# Patient Record
Sex: Female | Born: 1985 | Race: Black or African American | Hispanic: No | Marital: Single | Smoking: Never smoker
Health system: Southern US, Community
[De-identification: ages and names within clinical notes are randomized; demographics above are authoritative.]

## PROBLEM LIST (undated history)

## (undated) DIAGNOSIS — E282 Polycystic ovarian syndrome: Secondary | ICD-10-CM

## (undated) DIAGNOSIS — O24419 Gestational diabetes mellitus in pregnancy, unspecified control: Secondary | ICD-10-CM

## (undated) DIAGNOSIS — O139 Gestational [pregnancy-induced] hypertension without significant proteinuria, unspecified trimester: Secondary | ICD-10-CM

## (undated) HISTORY — PX: TOOTH EXTRACTION: SUR596

---
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
## 2005-11-26 HISTORY — PX: WISDOM TOOTH EXTRACTION: SHX21

## 2007-04-09 ENCOUNTER — Ambulatory Visit (HOSPITAL_COMMUNITY): Admission: RE | Admit: 2007-04-09 | Discharge: 2007-04-09 | Payer: Self-pay | Admitting: Obstetrics & Gynecology

## 2007-05-07 ENCOUNTER — Encounter: Payer: Self-pay | Admitting: Obstetrics & Gynecology

## 2007-05-07 ENCOUNTER — Inpatient Hospital Stay (HOSPITAL_COMMUNITY): Admission: AD | Admit: 2007-05-07 | Discharge: 2007-05-10 | Payer: Self-pay | Admitting: Obstetrics & Gynecology

## 2007-05-09 ENCOUNTER — Encounter: Payer: Self-pay | Admitting: Obstetrics & Gynecology

## 2007-05-15 ENCOUNTER — Inpatient Hospital Stay (HOSPITAL_COMMUNITY): Admission: AD | Admit: 2007-05-15 | Discharge: 2007-07-22 | Payer: Self-pay | Admitting: Obstetrics

## 2007-05-16 ENCOUNTER — Encounter: Payer: Self-pay | Admitting: Obstetrics & Gynecology

## 2007-06-16 ENCOUNTER — Encounter: Payer: Self-pay | Admitting: Obstetrics

## 2007-06-30 ENCOUNTER — Encounter: Payer: Self-pay | Admitting: Obstetrics & Gynecology

## 2007-07-07 ENCOUNTER — Encounter: Payer: Self-pay | Admitting: Obstetrics

## 2007-07-31 ENCOUNTER — Inpatient Hospital Stay (HOSPITAL_COMMUNITY): Admission: AD | Admit: 2007-07-31 | Discharge: 2007-08-07 | Payer: Self-pay | Admitting: Obstetrics & Gynecology

## 2009-03-05 IMAGING — US US OB DETAIL+14 WK
1 series · 14 of 28 positions shown · non-contrast
Comparison: none

OBSTETRICAL ULTRASOUND:
 This ultrasound was performed in The [HOSPITAL], and the [facility] report will be stored to [REDACTED] PACS.

[Series 1: us ob detail+14 wk · 14 of 92 slices shown]
[im 4/92]
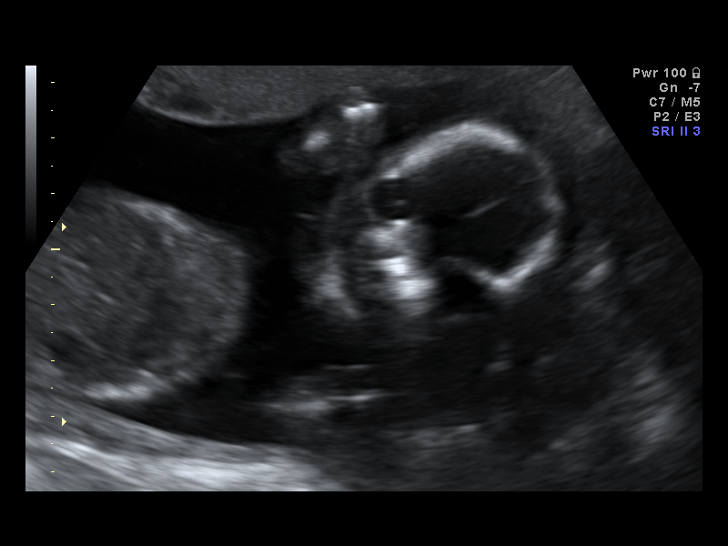
[im 11/92]
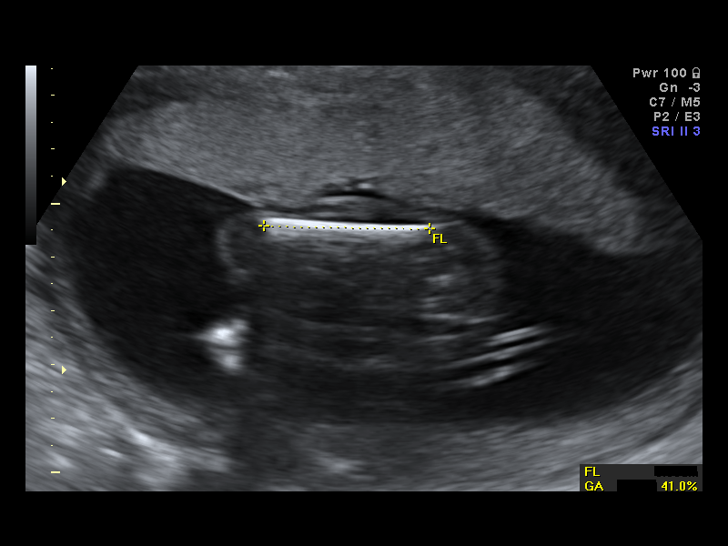
[im 17/92]
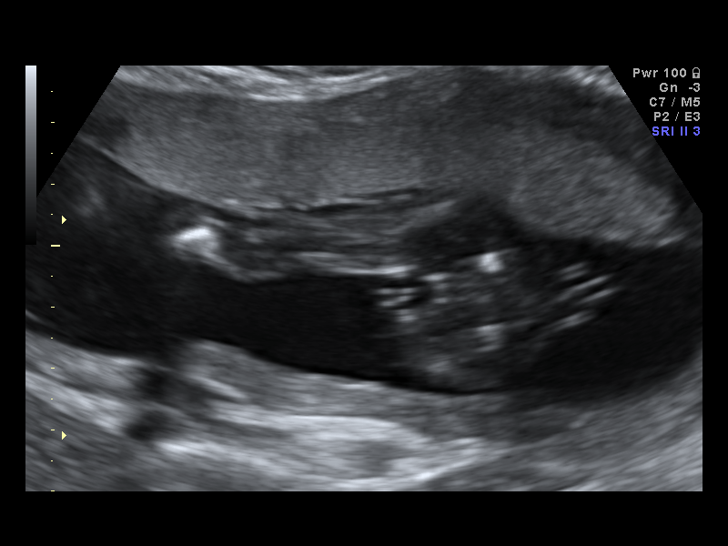
[im 24/92]
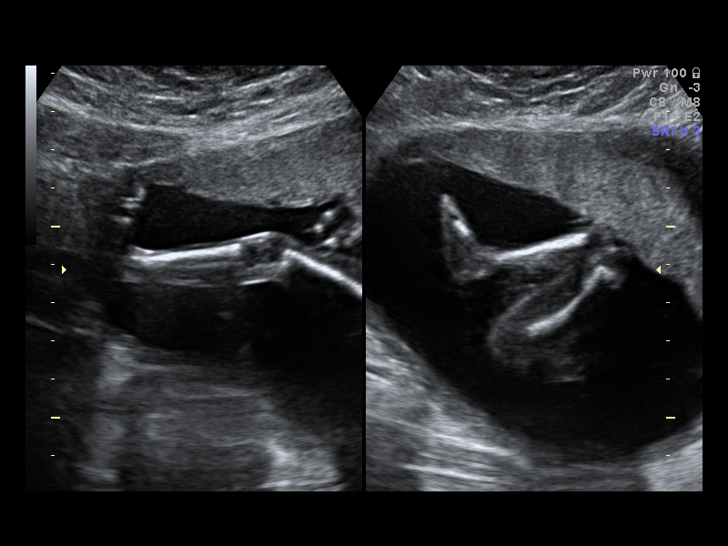
[im 31/92]
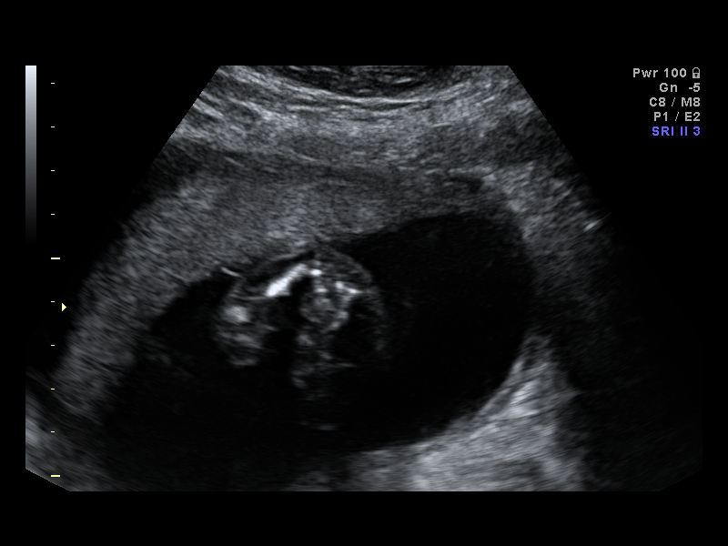
[im 38/92]
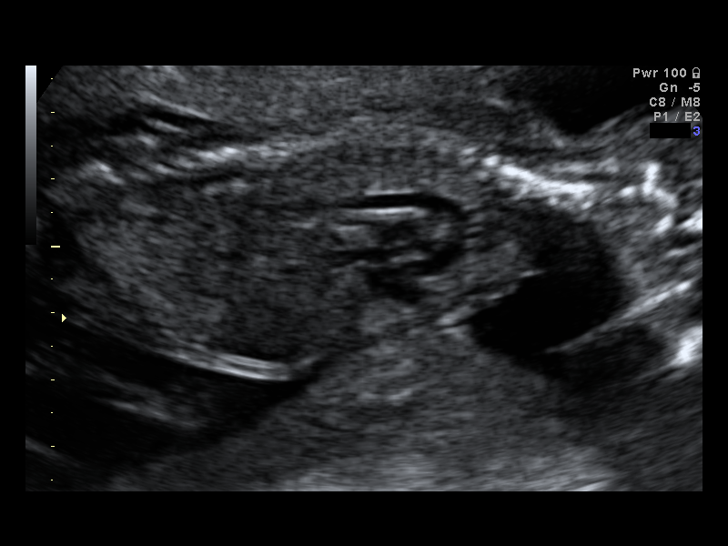
[im 44/92]
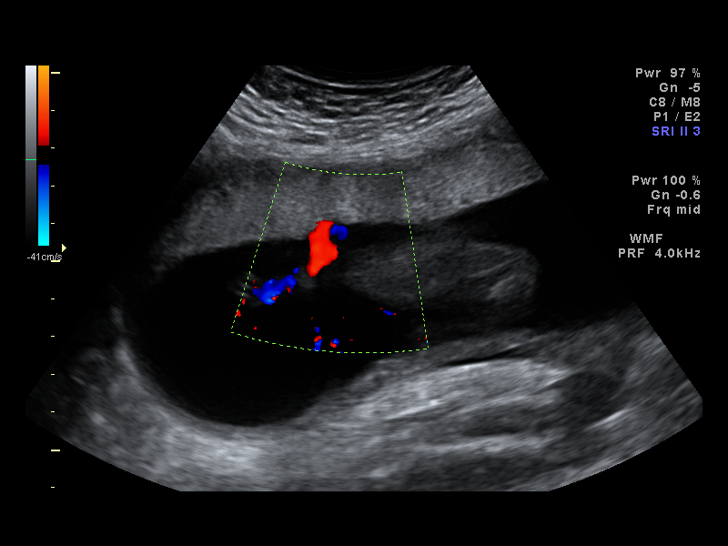
[im 51/92]
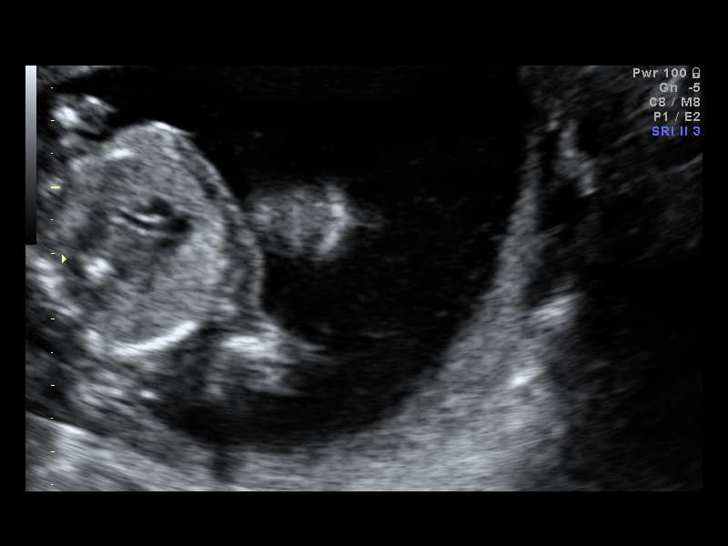
[im 58/92]
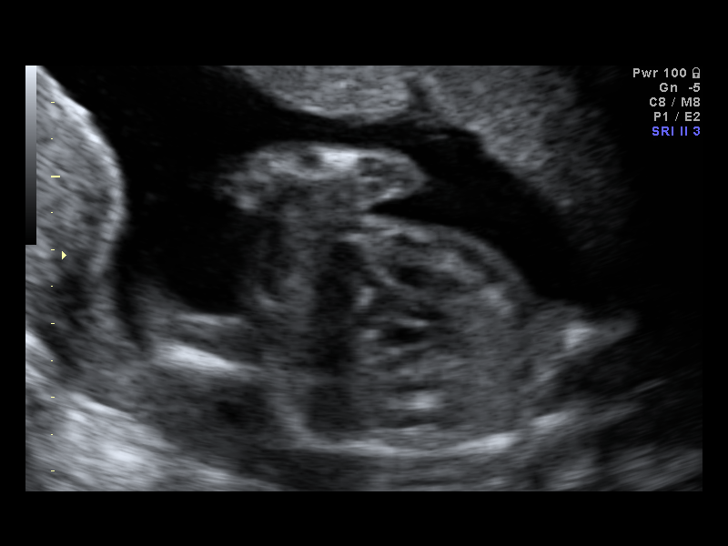
[im 65/92]
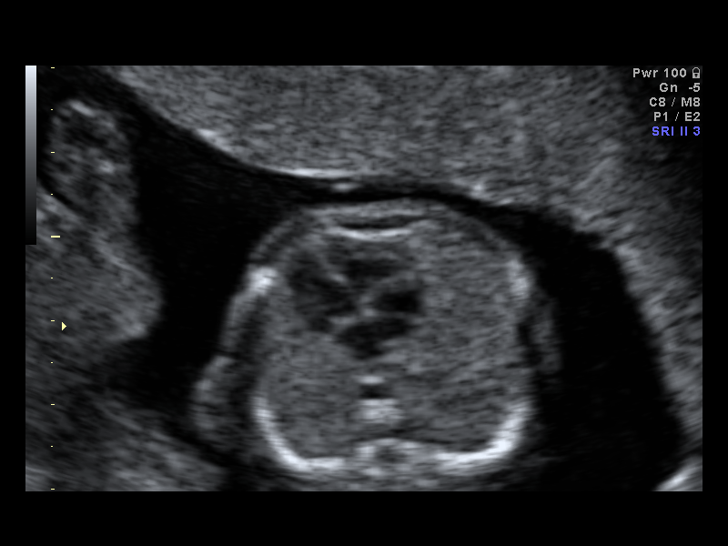
[im 71/92]
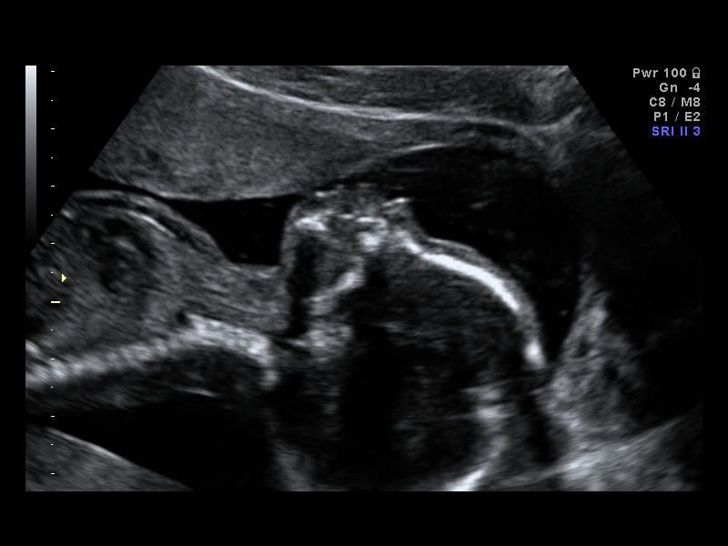
[im 78/92]
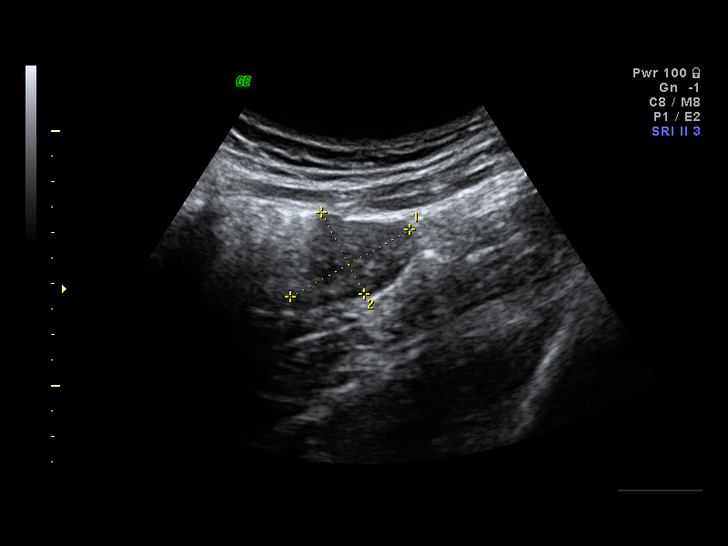
[im 85/92]
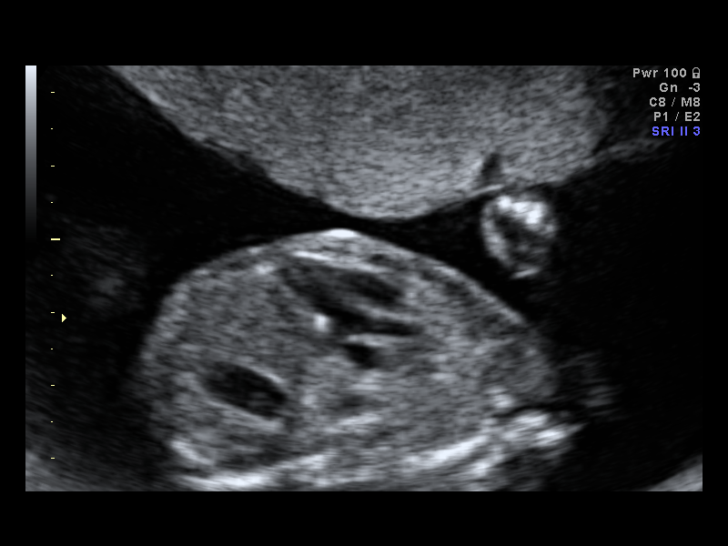
[im 92/92]
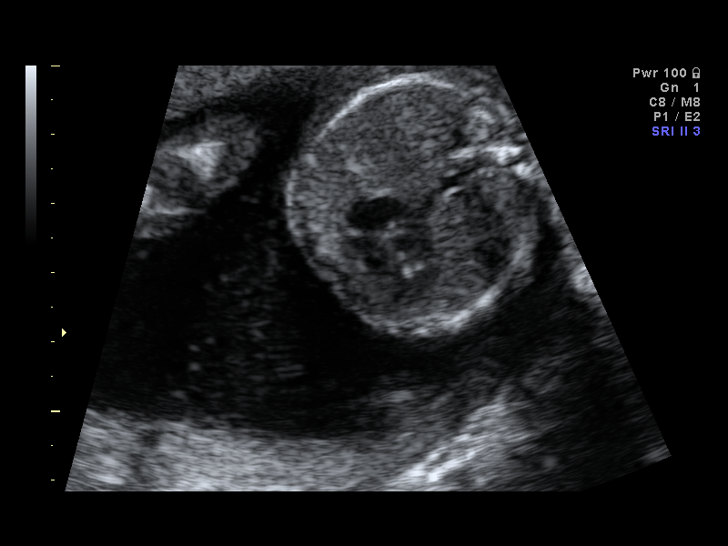

[14 of 28 positions shown; findings below may reference images not displayed]

IMPRESSION: The [facility] report has also been faxed to the ordering physician.

## 2009-04-04 IMAGING — US US OB TRANSVAGINAL
1 series · 13 of 13 positions shown · non-contrast
Comparison: none

OBSTETRICAL ULTRASOUND:
 This ultrasound was performed in The [HOSPITAL], and the [facility] report will be stored to [REDACTED] PACS.

[Series 1: us ob transvaginal · 13 acquisitions, 13 frames shown]
[im 1/13]
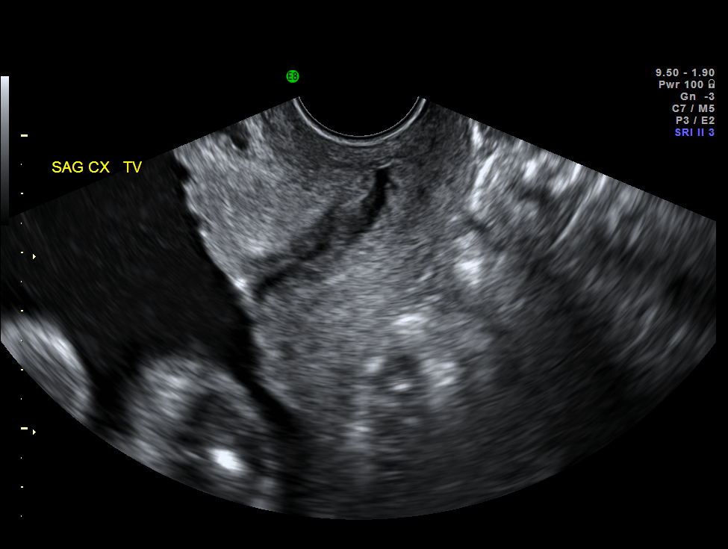
[im 2/13]
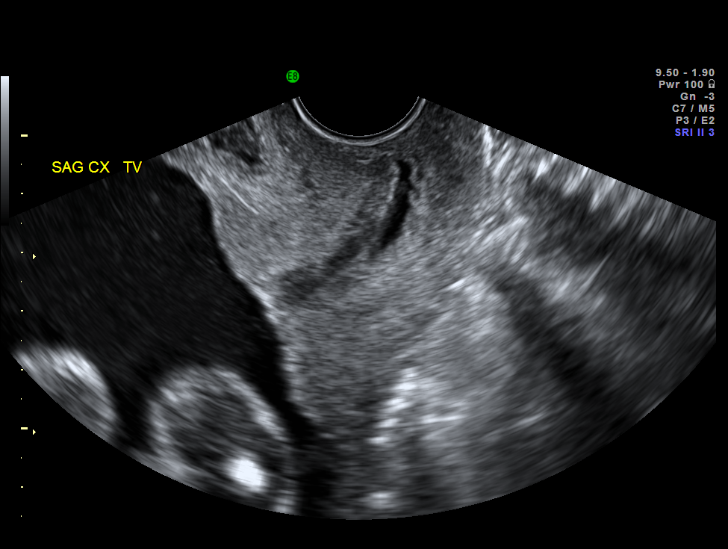
[im 3/13]
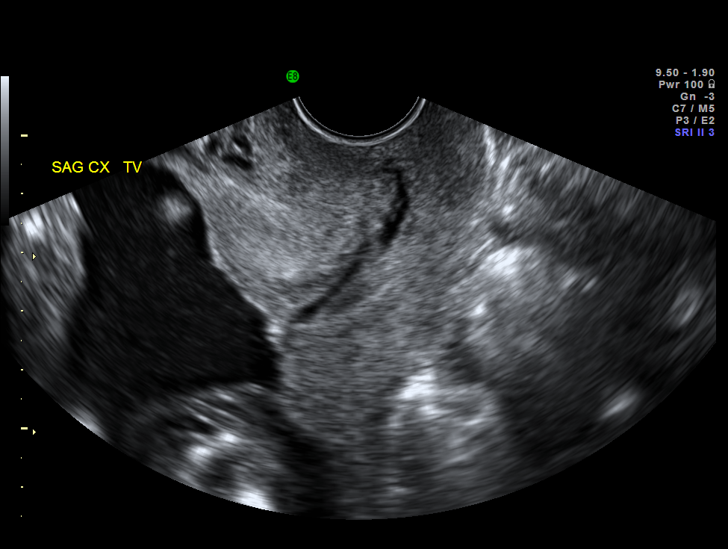
[im 4/13]
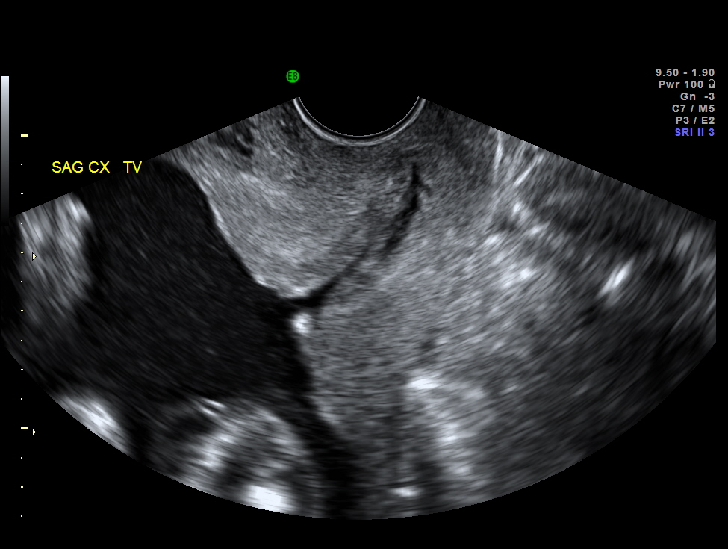
[im 5/13]
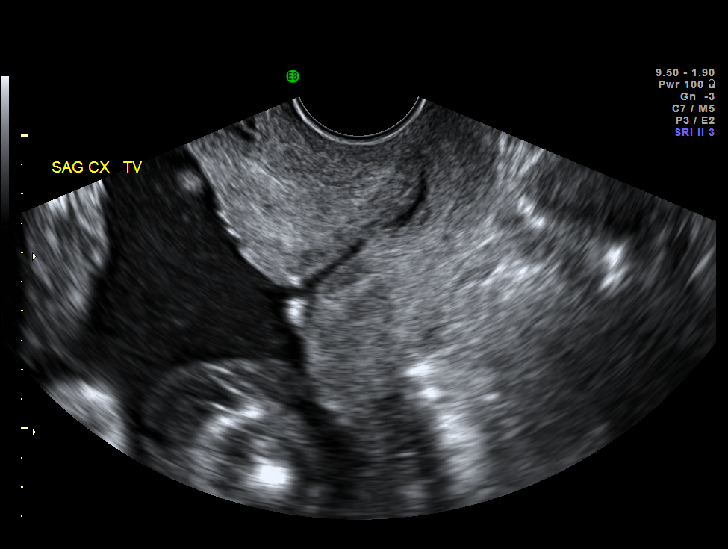
[im 6/13]
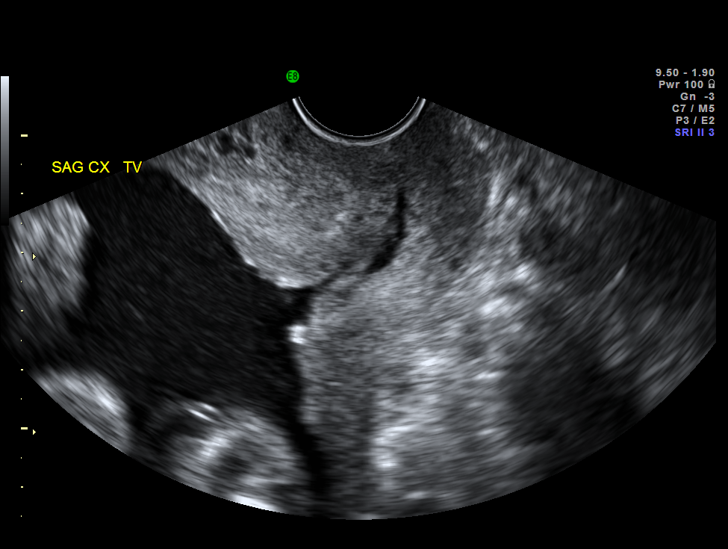
[im 7/13]
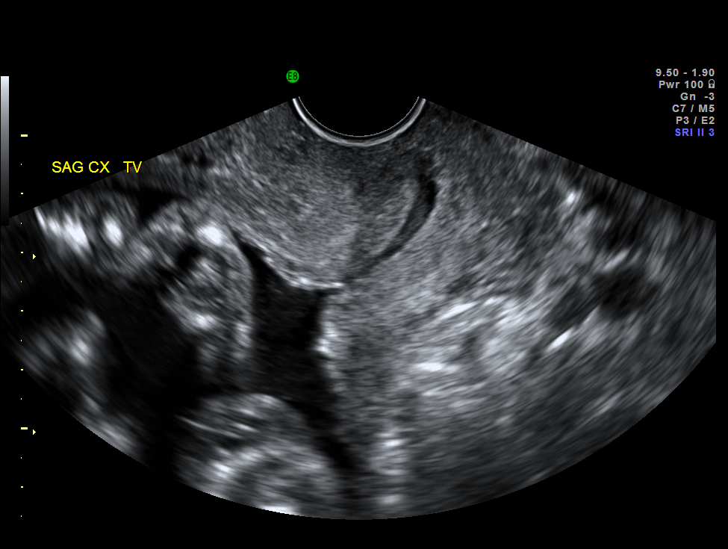
[im 8/13]
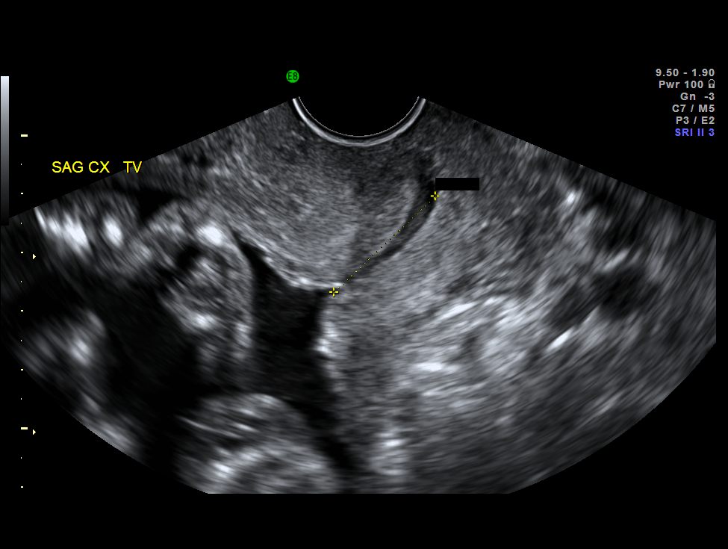
[im 9/13]
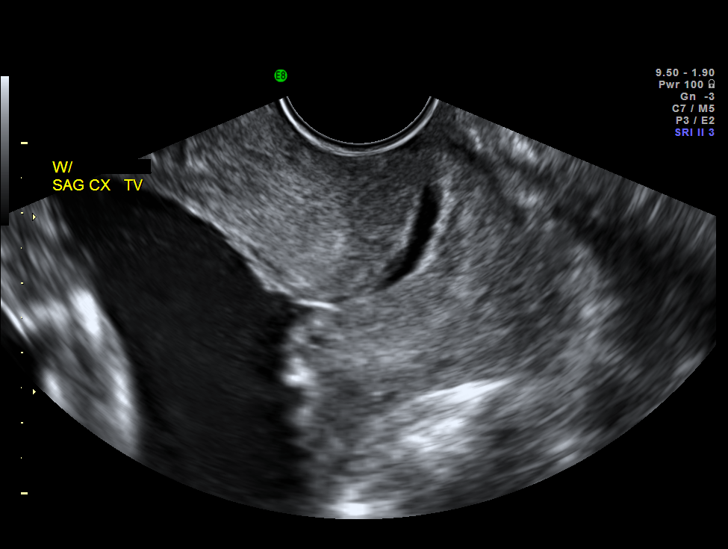
[im 10/13]
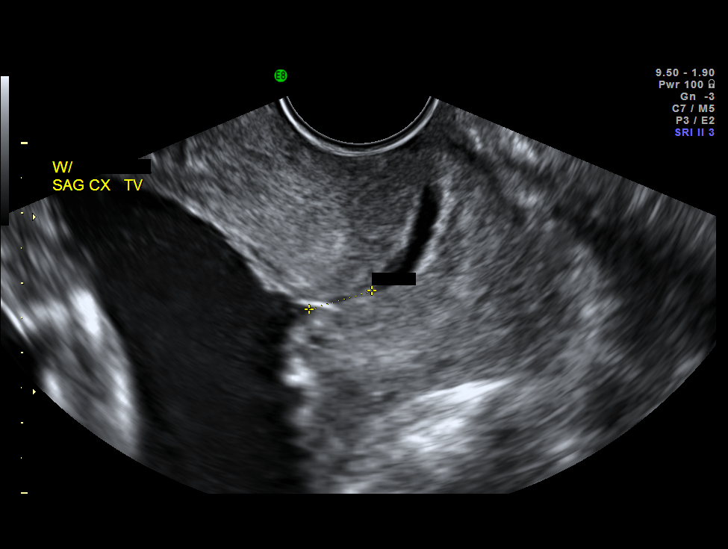
[im 11/13]
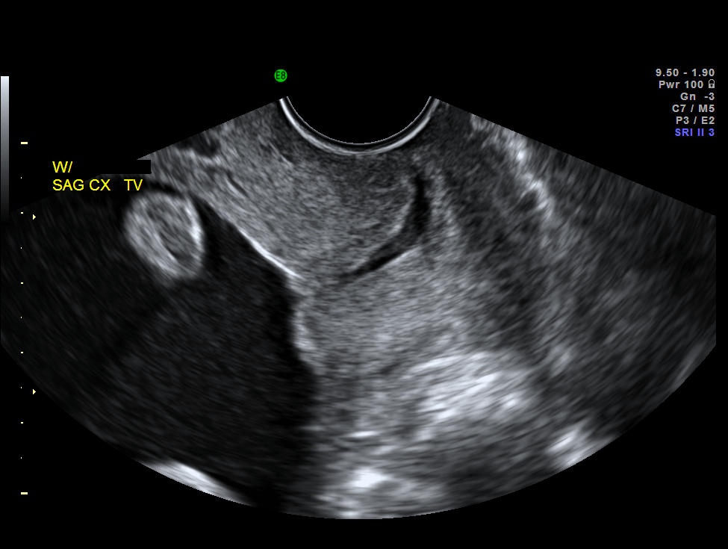
[im 12/13]
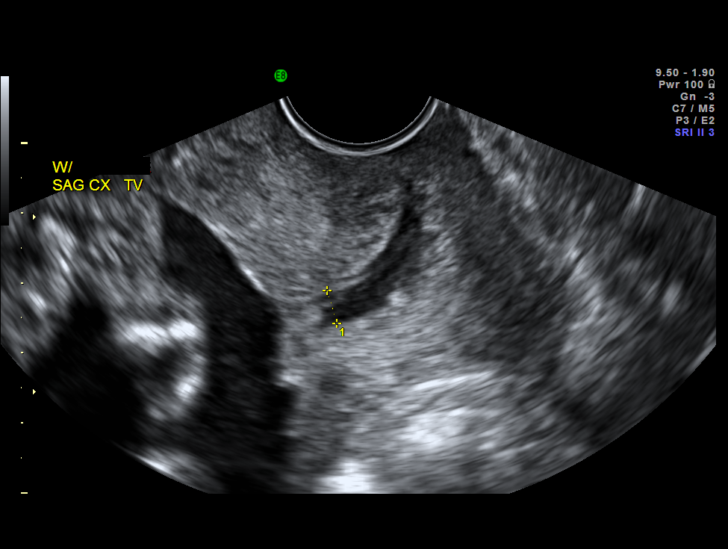
[im 13/13]
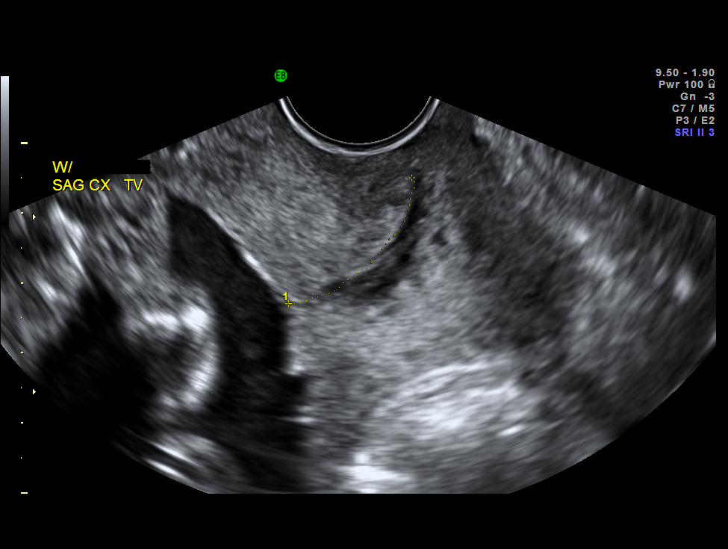

[13 of 13 positions shown; findings below may reference images not displayed]

IMPRESSION: The [facility] report has also been faxed to the ordering physician.

## 2009-06-21 ENCOUNTER — Emergency Department (HOSPITAL_BASED_OUTPATIENT_CLINIC_OR_DEPARTMENT_OTHER): Admission: EM | Admit: 2009-06-21 | Discharge: 2009-06-21 | Payer: Self-pay | Admitting: Emergency Medicine

## 2010-11-02 ENCOUNTER — Emergency Department (HOSPITAL_BASED_OUTPATIENT_CLINIC_OR_DEPARTMENT_OTHER): Admission: EM | Admit: 2010-11-02 | Discharge: 2010-10-18 | Payer: Self-pay | Admitting: Emergency Medicine

## 2011-02-06 LAB — URINALYSIS, ROUTINE W REFLEX MICROSCOPIC
Bilirubin Urine: NEGATIVE
Glucose, UA: NEGATIVE mg/dL
Hgb urine dipstick: NEGATIVE
Ketones, ur: 15 mg/dL — AB
pH: 6 (ref 5.0–8.0)

## 2011-02-06 LAB — URINE MICROSCOPIC-ADD ON

## 2011-03-04 LAB — URINE CULTURE: Colony Count: 45000

## 2011-03-04 LAB — URINALYSIS, ROUTINE W REFLEX MICROSCOPIC
Protein, ur: NEGATIVE mg/dL
Urobilinogen, UA: 1 mg/dL (ref 0.0–1.0)

## 2011-03-04 LAB — PREGNANCY, URINE: Preg Test, Ur: NEGATIVE

## 2011-03-04 LAB — GC/CHLAMYDIA PROBE AMP, GENITAL: Chlamydia, DNA Probe: NEGATIVE

## 2011-03-04 LAB — URINE MICROSCOPIC-ADD ON

## 2011-03-04 LAB — WET PREP, GENITAL: Trich, Wet Prep: NONE SEEN

## 2011-04-10 NOTE — H&P (Signed)
[name], [name]              ACCOUNT NO.:  [id number]   MEDICAL RECORD NO.:  [id number]          PATIENT TYPE:  INP   LOCATION:  9153                          FACILITY:  [facility]   PHYSICIAN:  Roseanna Rainbow, M.D.DATE OF BIRTH:  1986-05-17   DATE OF ADMISSION:  05/07/2007  DATE OF DISCHARGE:                              HISTORY & PHYSICAL   CHIEF COMPLAINT:  The patient is a 25 year old para 0 female with an  estimated date of confinement of 08/30/07  with an intrauterine  pregnancy at 23 plus weeks with dynamic changes seen on ultrasound  today.   HISTORY OF PRESENT ILLNESS:  Please see the above.   The patient had an initial anatomy scan several weeks prior and of note  there was an absent nasal bone.  Today's study was a follow up study.  Again the nasal bone was absent.  The patient declined genetic  amniocentesis but the cervix was noted to have dynamic changes with the  initial length being normal and then on subsequent images there was no  measurable cervix.   ALLERGIES:  No known drug allergies.   MEDICATIONS:  Please see the medication reconciliation form.   OB RISK FACTORS:  None.   PRENATAL LABORATORY STUDIES:  Urine culture and sensitivity, no  uropathogens.  Hepatitis B surface antigen negative.  Hematocrit of 41.  Hemoglobin of 13.3.  Platelets are 267,000.  Blood type is A positive,  antibody screen negative.  Rubella immune.  Sickle cell is negative.  Chlamydia probe is negative.  GC probe is negative.  Pap smear negative.  Varicella immune.  Wet prep in 03/08 was negative for gonorrheal  vaginalis.   SOCIAL HISTORY:  She denies any tobacco, ethanol, or drug use.   PAST MEDICAL HISTORY:  Eczema.   PAST SURGICAL HISTORY:  Oral surgery.   FAMILY HISTORY:  The family history is noncontributory.   PHYSICAL EXAMINATION:  VITAL SIGNS:  On physical examination the vital  signs show a temperature of 98.3, pulse of 93, respirations 18, blood  pressure of  140/75.  GENERAL:  Well-developed, well-nourished, in no apparent distress.  ABDOMEN:  Gravid, nontender.  PELVIC:  Examination deferred.   ASSESSMENT:  Intrauterine pregnancy at 23 plus weeks with rule out  cervical insufficiency versus threatened preterm labor.   PLAN:  Admission, bedrest, broad spectrum parenteral antibiotics.  Will  likely repeat the cervical length ultrasound in two days, follow blood  pressures.      Roseanna Rainbow, M.D.  Electronically Signed     LAJ/MEDQ  D:  05/07/2007  T:  05/07/2007  Job:  [id number]   cc:   Roseanna Rainbow, M.D.  Fax: [phone]

## 2011-04-10 NOTE — H&P (Signed)
[name], [name]              ACCOUNT NO.:  [id number]   MEDICAL RECORD NO.:  [id number]          PATIENT TYPE:  INP   LOCATION:  9149                          FACILITY:  [facility]   PHYSICIAN:  Roseanna Rainbow, M.D.DATE OF BIRTH:  1986/03/14   DATE OF ADMISSION:  05/15/2007  DATE OF DISCHARGE:                              HISTORY & PHYSICAL   CHIEF COMPLAINT:  The patient is a 25 year old para 0, with an estimated  date of confinement of August 30, 2007, with an intrauterine pregnancy  at 24+ weeks with cervical insufficiency with an ultrasound today  suggesting further shortening of the cervix.   HISTORY OF PRESENT ILLNESS:  Please see the above.  The patient was  recently hospitalized approximately one week ago with incidental dynamic  changes seen on ultrasound of the cervix.  The cervix remained stable on  bedrest in the hospital and she was discharged to home.  She presented  to the office today for followup.  She denied any complaints.  However,  on pelvic exam there was lower uterine segment development noted and the  cervix was felt approximately 50% effaced.  On an informal vaginal  ultrasound performed in the office the cervix had a large funnel with  the internal os being approximately 1-to-2-cm dilated.  The residual  closed length of cervix was approximately 1.5-cm.   ALLERGIES:  No known drug allergies.   MEDICATIONS:  Please see the medication reconciliation form.   OBSTETRICAL RISK FACTORS:  Absent nasal bone.   PRENATAL LABORATORY STUDIES:  Urine culture and sensitivity with no  uropathogens.  Hepatitis B surface antigen negative.  Hematocrit 41,  hemoglobin 13.3, platelets 267,000.  Blood type is A positive.  Antibody  screen negative.  Rubella immune.  Sickle cell is negative.  Chlamydia  probe is negative.  GC probe is negative.  Pap smear negative.  Varicella immune.  Wet prep in March 2008, was negative for Gardnerella  vaginalis.   SOCIAL HISTORY:   She denies any tobacco, ethanol, or drug use.   PAST MEDICAL HISTORY:  Eczema.   PAST SURGICAL HISTORY:  Oral surgery.   FAMILY HISTORY:  The family history is noncontributory.   PHYSICAL EXAMINATION:  VITAL SIGNS:  Stable afebrile.  GENERAL:  Well-developed, well-nourished, no apparent distress.  ABDOMEN:  Gravid.  PELVIC:  The cervix is approximately 50% effaced and closed.   ASSESSMENT:  Intrauterine pregnancy at 24+ weeks with cervical  insufficiency, now with an examination worrisome for progressive preterm  premature cervical change.   PLAN:  Admission, hospitalized bedrest, betamethasone, magnesium  sulfate, tocolysis.  Continue Prometrium per vagina.  Monitor closely.      Roseanna Rainbow, M.D.  Electronically Signed     LAJ/MEDQ  D:  05/15/2007  T:  05/15/2007  Job:  [id number]

## 2011-04-13 NOTE — Discharge Summary (Signed)
[name], Ball              ACCOUNT NO.:  [id number]   MEDICAL RECORD NO.:  [id number]          PATIENT TYPE:  INP   LOCATION:  9149                          FACILITY:  [facility]   PHYSICIAN:  Roseanna Rainbow, M.D.DATE OF BIRTH:  1986-03-26   DATE OF ADMISSION:  05/15/2007  DATE OF DISCHARGE:  07/22/2007                               DISCHARGE SUMMARY   CHIEF COMPLAINT:  The patient is a 25 year old para 0 with an estimated  date of confinement of August 30, 2007 with an intrauterine pregnancy at  24+ weeks with cervical insufficiency with an ultrasound earlier today  suggesting progressive shortening of the cervix.  Please see the  dictated history and physical for further details.   HOSPITAL COURSE:  The patient was admitted.  She was tocolized with  magnesium sulfate.  She also received a course of betamethasone.  The  Prometrium per vagina was continued as well.  The maternal fetal center  was consulted, and they continued to follow the patient.  On a repeat  ultrasound of the cervical length on June 28 there was no measurable  cervix noted with dynamic funneling noted.  A 1-hour GCT was performed,  and this was elevated.  This was followed by a 3-hour GTT.  On July 1 a  sterile vaginal exam was performed and the cervix was felt to be 60%  effaced with the increased lower uterine segment development.  On July  16 the patient was complaining of uterine contractions.  On a repeat  sterile vaginal exam the cervix was 1 and 60%, again with increased  lower uterine segment development appreciated.  The magnesium sulfate  was discontinued, and she was tocolized with terbutaline given  subcutaneously.  This was discontinued, and the patient was then changed  to oral Procardia for tocolysis.   The patient was also given a course of Indocin for 48 hours.  The  maternal fetal center was reconsulted on August 11, per their exam the  cervix was 3 cm dilated, 75% effaced with a vertex  at a -3.  The  recommendation was to continue hospitalized bed rest and tocolysis until  [redacted] weeks gestation.  On August 26 the cervix was reassessed, and it was  felt to be without change.  At this point the patient was 34-3/7 weeks  estimated gestational age, and decision was made to discontinue her  tocolytics as well as the Prometrium vaginally.  She was discharged to  home.   DISCHARGE DIAGNOSIS:  Cervical insufficiency, threatened preterm labor.   CONDITION:  Stable.   DIET:  Regular.   ACTIVITY:  Modified bed rest, pelvic rest.   MEDICATIONS:  Prenatal vitamins.   DISPOSITION:  The patient was to follow up in the office in several  days.      Roseanna Rainbow, M.D.  Electronically Signed     LAJ/MEDQ  D:  08/19/2007  T:  08/20/2007  Job:  [id number]

## 2011-04-13 NOTE — Discharge Summary (Signed)
[name], [name]              ACCOUNT NO.:  [id number]   MEDICAL RECORD NO.:  [id number]          PATIENT TYPE:  INP   LOCATION:                                [facility]ACILITY:  [facility]   PHYSICIAN:  Charles A. Clearance Coots, M.D.DATE OF BIRTH:  07-27-1986   DATE OF ADMISSION:  07/31/2007  DATE OF DISCHARGE:  08/07/2007                               DISCHARGE SUMMARY   ADMITTING DIAGNOSES:  Thirty-five weeks gestation, severe preeclampsia.   DISCHARGE DIAGNOSES:  Thirty-five weeks gestation, severe preeclampsia,  status post induction of labor for severe preeclampsia, normal  spontaneous vaginal delivery, viable female on August 01, 2007 at  20:06.  Apgars of 8 at 1 minute, 9 at 5 minutes.  Weight of 2295 grams,  length of 46.5 cm.  Mother and infant discharged home in good condition.   REASON FOR ADMISSION:  A 25 year old para 0, estimated date of  confinement of August 30, 2007, presents with elevated blood pressures  and therefore admitted for preeclampsia.   PAST MEDICAL HISTORY:  Surgery:  Oral surgery.  Illnesses:  Eczema.   MEDICATIONS:  Prenatal vitamins.   ALLERGIES:  NO KNOWN DRUG ALLERGIES   SOCIAL HISTORY:  Negative for tobacco, alcohol, or recreational drug  use.   PHYSICAL EXAM:  GENERAL:  Well-nourished, well-developed female in no  acute distress.  VITAL SIGNS:  Blood pressures were 140s/110s.  LUNGS:  Clear to auscultation bilaterally.  HEART:  Regular rate and rhythm.  ABDOMEN:  Gravid, nontender.  Cervix 2 to 3 cm dilated, 60% effaced,  vertex was at -3 station.   ADMITTING LABORATORY VALUES:  Hemoglobin 12, hematocrit 36, white blood  cell count 8,000, platelets 215,000.  Comprehensive metabolic panel was  significant for uric acid of 6.7.  Liver enzymes were within normal  limits.  Urine was significant for specific gravity greater than 1.030,  greater than 300 of protein, but was trace hemoglobin, mild bilirubin.   HOSPITAL COURSE:  The patient was admitted  and started on low-dose  Pitocin per protocol.  She progressed to normal spontaneous vaginal  delivery on August 01, 2007.  There were no complications.  Postpartum  course was uncomplicated.  The patient was discharged home, after blood  pressures had normalized on p.o. labetalol, on postpartum day #6.   DISCHARGE LABORATORY VALUES:  Hemoglobin 10, hematocrit 29, white blood  cell count 7,500, platelets 208,000.  Comprehensive metabolic panel was  within normal limits   DISCHARGE DISPOSITION:  Medications:  Continue prenatal vitamins.  Labetalol was prescribed for blood pressure management.  Routine written  instructions were given for discharge after vaginal delivery.  The  patient was to call the office for follow-up appointment in 1 week for  blood pressure check.      Charles A. Clearance Coots, M.D.  Electronically Signed     CAH/MEDQ  D:  08/22/2007  T:  08/23/2007  Job:  [id number]

## 2011-04-13 NOTE — Discharge Summary (Signed)
[name], [name]              ACCOUNT NO.:  [id number]   MEDICAL RECORD NO.:  [id number]          PATIENT TYPE:  INP   LOCATION:  9116                          [facility]ILITY:  [facility]   PHYSICIAN:  Roseanna Rainbow, M.D.DATE OF BIRTH:  08-15-1986   DATE OF ADMISSION:  07/31/2007  DATE OF DISCHARGE:  08/07/2007                               DISCHARGE SUMMARY   CHIEF COMPLAINTS:  The patient is a 25 year old para 0 with an estimated  date of confinement of October 4 with an intrauterine pregnancy at 23+  weeks with dynamic changes seen on an ultrasound earlier today.  Please  see the dictated history and physical for further details.   HOSPITAL COURSE:  The patient was admitted.  She was started on broad-  spectrum parenteral antibiotics.  On the cervical length ultrasound  performed on June 14, the cervix was 2.8 cm in length.  There was a 0.4-  cm funnel.  On digital exam, the cervix was long, closed and posterior.  At this point, the plan was to discharge the patient to home on bedrest  and pelvic rest.   DISCHARGE DIAGNOSIS:  Intrauterine pregnancy at 23+ weeks with cervical  insufficiency.   CONDITION:  Stable.   DIET:  Regular.   ACTIVITY:  Was bedrest, pelvic rest.   MEDICATIONS:  Included clindamycin and Prometrium.   DISPOSITION:  The patient was to follow up in the office in 1 week.      Roseanna Rainbow, M.D.  Electronically Signed     LAJ/MEDQ  D:  08/19/2007  T:  08/20/2007  Job:  [id number]

## 2011-04-17 ENCOUNTER — Inpatient Hospital Stay (HOSPITAL_COMMUNITY)
Admission: AD | Admit: 2011-04-17 | Discharge: 2011-04-17 | Disposition: A | Discharge status: Home / Self Care | Source: Ambulatory Visit | Attending: Obstetrics | Admitting: Obstetrics

## 2011-04-17 DIAGNOSIS — R109 Unspecified abdominal pain: Secondary | ICD-10-CM

## 2011-04-17 DIAGNOSIS — O99891 Other specified diseases and conditions complicating pregnancy: Secondary | ICD-10-CM | POA: Insufficient documentation

## 2011-04-17 DIAGNOSIS — O9989 Other specified diseases and conditions complicating pregnancy, childbirth and the puerperium: Secondary | ICD-10-CM

## 2011-04-17 LAB — URINALYSIS, ROUTINE W REFLEX MICROSCOPIC
Bilirubin Urine: NEGATIVE
Ketones, ur: 40 mg/dL — AB
Nitrite: NEGATIVE
Urobilinogen, UA: 2 mg/dL — ABNORMAL HIGH (ref 0.0–1.0)

## 2011-04-17 LAB — WET PREP, GENITAL: Yeast Wet Prep HPF POC: NONE SEEN

## 2011-05-01 LAB — ABO/RH: RH Type: POSITIVE

## 2011-05-01 LAB — CBC
HCT: 36 % (ref 36–46)
Hemoglobin: 11.9 g/dL — AB (ref 12.0–16.0)
Platelets: 262 10*3/uL (ref 150–399)

## 2011-05-01 LAB — HIV ANTIBODY (ROUTINE TESTING W REFLEX): HIV: NONREACTIVE

## 2011-05-01 LAB — RUBELLA ANTIBODY, IGM: Rubella: IMMUNE

## 2011-05-01 LAB — HEPATITIS B SURFACE ANTIGEN: Hepatitis B Surface Ag: NEGATIVE

## 2011-05-01 LAB — ANTIBODY SCREEN: Antibody Screen: NEGATIVE

## 2011-05-03 ENCOUNTER — Ambulatory Visit: Payer: Self-pay | Admitting: Obstetrics & Gynecology

## 2011-08-04 ENCOUNTER — Encounter (HOSPITAL_COMMUNITY): Payer: Self-pay | Admitting: *Deleted

## 2011-08-04 ENCOUNTER — Inpatient Hospital Stay (HOSPITAL_COMMUNITY)
Admission: AD | Admit: 2011-08-04 | Discharge: 2011-08-04 | Disposition: A | Discharge status: Home / Self Care | Source: Ambulatory Visit | Attending: Obstetrics & Gynecology | Admitting: Obstetrics & Gynecology

## 2011-08-04 DIAGNOSIS — R51 Headache: Secondary | ICD-10-CM | POA: Insufficient documentation

## 2011-08-04 DIAGNOSIS — R109 Unspecified abdominal pain: Secondary | ICD-10-CM | POA: Insufficient documentation

## 2011-08-04 DIAGNOSIS — O4702 False labor before 37 completed weeks of gestation, second trimester: Secondary | ICD-10-CM

## 2011-08-04 DIAGNOSIS — M7989 Other specified soft tissue disorders: Secondary | ICD-10-CM | POA: Insufficient documentation

## 2011-08-04 DIAGNOSIS — R42 Dizziness and giddiness: Secondary | ICD-10-CM | POA: Insufficient documentation

## 2011-08-04 DIAGNOSIS — O479 False labor, unspecified: Secondary | ICD-10-CM

## 2011-08-04 DIAGNOSIS — O9989 Other specified diseases and conditions complicating pregnancy, childbirth and the puerperium: Secondary | ICD-10-CM | POA: Insufficient documentation

## 2011-08-04 DIAGNOSIS — O139 Gestational [pregnancy-induced] hypertension without significant proteinuria, unspecified trimester: Secondary | ICD-10-CM

## 2011-08-04 DIAGNOSIS — O4700 False labor before 37 completed weeks of gestation, unspecified trimester: Secondary | ICD-10-CM

## 2011-08-04 DIAGNOSIS — O132 Gestational [pregnancy-induced] hypertension without significant proteinuria, second trimester: Secondary | ICD-10-CM

## 2011-08-04 HISTORY — DX: Gestational diabetes mellitus in pregnancy, unspecified control: O24.419

## 2011-08-04 LAB — URINALYSIS, ROUTINE W REFLEX MICROSCOPIC
Bilirubin Urine: NEGATIVE
Glucose, UA: NEGATIVE mg/dL
Glucose, UA: NEGATIVE mg/dL
Ketones, ur: 15 mg/dL — AB
Ketones, ur: 15 mg/dL — AB
Nitrite: NEGATIVE
Protein, ur: 30 mg/dL — AB
pH: 6 (ref 5.0–8.0)

## 2011-08-04 LAB — COMPREHENSIVE METABOLIC PANEL
Alkaline Phosphatase: 93 U/L (ref 39–117)
BUN: 5 mg/dL — ABNORMAL LOW (ref 6–23)
GFR calc Af Amer: >60 mL/min (ref >60)
GFR calc non Af Amer: >60 mL/min (ref >60)
Glucose, Bld: 92 mg/dL (ref 70–99)
Potassium: 3.3 mEq/L — ABNORMAL LOW (ref 3.5–5.1)
Total Protein: 6.1 g/dL (ref 6.0–8.3)

## 2011-08-04 LAB — URINE MICROSCOPIC-ADD ON

## 2011-08-04 LAB — CBC
HCT: 28 % — ABNORMAL LOW (ref 36.0–46.0)
Hemoglobin: 9 g/dL — ABNORMAL LOW (ref 12.0–15.0)
MCH: 25.8 pg — ABNORMAL LOW (ref 26.0–34.0)
MCHC: 32.1 g/dL (ref 30.0–36.0)

## 2011-08-04 LAB — URIC ACID: Uric Acid, Serum: 6.8 mg/dL (ref 2.4–7.0)

## 2011-08-04 MED ORDER — BUTALBITAL-APAP-CAFFEINE 50-325-40 MG PO TABS: 1.0000 | ORAL_TABLET | Freq: Four times a day (QID) | ORAL | Status: AC | PRN

## 2011-08-04 MED ORDER — BUTALBITAL-APAP-CAFFEINE 50-325-40 MG PO TABS
2.0000 | ORAL_TABLET | Freq: Once | ORAL | Status: AC
  Administered 2011-08-04: 2 via ORAL
  Filled 2011-08-04: qty 2

## 2011-08-04 MED ORDER — NIFEDIPINE 10 MG PO CAPS
10.0000 mg | ORAL_CAPSULE | ORAL | Status: DC | PRN
  Administered 2011-08-04 (×3): 10 mg via ORAL
  Filled 2011-08-04 (×3): qty 1

## 2011-08-04 NOTE — ED Provider Notes (Signed)
Chief Complaint:  Abdominal Cramping, Headache, Leg Swelling and Dizziness   [name] is  25 y.o. G2P0101.  No LMP recorded. Patient is pregnant..  [redacted]w[redacted]d She presents complaining of Abdominal Cramping, Headache, Leg Swelling and Dizziness Reports HA and dizziness that started at 0730 this morning, notices feet swelling this evening. Reports HA resolved spontaneous, denies visual disturbance. States hx of pre-x with last pregnancy requiring delivery at 35 weeks. Denies nausea, vomiting, or epigastric pain.  Obstetrical/Gynecological History: OB History    Grav Para Term Preterm Abortions TAB SAB Ect Mult Living   2 1 0 1 0 0 0 0 0 1       Past Medical History: Past Medical History  Diagnosis Date  . No pertinent past medical history   . Gestational diabetes     Past Surgical History: Past Surgical History  Procedure Date  . Wisdom tooth extraction 2007    Family History: No family history on file.  Social History: History  Substance Use Topics  . Smoking status: Never Smoker   . Smokeless tobacco: Never Used  . Alcohol Use: No    Allergies: No Known Allergies  Prescriptions prior to admission  Medication Sig Dispense Refill  . acetaminophen (TYLENOL) 500 MG tablet Take 500 mg by mouth every 6 (six) hours as needed. For pain       . prenatal vitamin w/FE, FA (PRENATAL 1 + 1) 27-1 MG TABS Take 1 tablet by mouth daily.          Review of Systems - Negative except what has been reviewed in the HPI  Physical Exam   Blood pressure 139/87, pulse 93, temperature 98.1 F (36.7 C), temperature source Oral, resp. rate 18, height 5\' 3"  (1.6 m), weight 82.555 kg (182 lb).  General: General appearance - alert, well appearing, and in no distress and overweight Mental status - alert, oriented to person, place, and time, normal mood, behavior, speech, dress, motor activity, and thought processes, affect appropriate to mood Chest - clear to auscultation, no wheezes, rales  or rhonchi, symmetric air entry Abdomen - Gravid, nontender Neurological - alert, oriented, normal speech, no focal findings or movement disorder noted, DTR's normal and symmetric Extremities - pedal edema 1+ Focused Gynecological Exam: normal external genitalia, vulva, vagina, cervix, uterus and adnexa, CERVIX: FT/Thick/post/vtx FHR: 130, mod variability, + 15x15 accels, no decels Toco: 4-6 palp mild  Labs: Recent Results (from the past 24 hour(s))  URINALYSIS, ROUTINE W REFLEX MICROSCOPIC   Collection Time   08/04/11  1:51 AM      Component Value Range   Color, Urine YELLOW  YELLOW    Appearance CLEAR  CLEAR    Specific Gravity, Urine 1.025  1.005 - 1.030    pH 6.0  5.0 - 8.0    Glucose, UA NEGATIVE  NEGATIVE (mg/dL)   Hgb urine dipstick NEGATIVE  NEGATIVE    Bilirubin Urine NEGATIVE  NEGATIVE    Ketones, ur 15 (*) NEGATIVE (mg/dL)   Protein, ur 30 (*) NEGATIVE (mg/dL)   Urobilinogen, UA 2.0 (*) 0.0 - 1.0 (mg/dL)   Nitrite NEGATIVE  NEGATIVE    Leukocytes, UA SMALL (*) NEGATIVE   URINE MICROSCOPIC-ADD ON   Collection Time   08/04/11  1:51 AM      Component Value Range   Squamous Epithelial / LPF FEW (*) RARE    WBC, UA 3-6  <3 (WBC/hpf)   RBC / HPF 0-2  <3 (RBC/hpf)   Bacteria, UA FEW (*) RARE  Crystals CA OXALATE CRYSTALS (*) NEGATIVE   CBC   Collection Time   08/04/11  2:04 AM      Component Value Range   WBC 6.1  4.0 - 10.5 (K/uL)   RBC 3.49 (*) 3.87 - 5.11 (MIL/uL)   Hemoglobin 9.0 (*) 12.0 - 15.0 (g/dL)   HCT 16.1 (*) 09.6 - 46.0 (%)   MCV 80.2  78.0 - 100.0 (fL)   MCH 25.8 (*) 26.0 - 34.0 (pg)   MCHC 32.1  30.0 - 36.0 (g/dL)   RDW 04.5  40.9 - 81.1 (%)   Platelets 211  150 - 400 (K/uL)  COMPREHENSIVE METABOLIC PANEL   Collection Time   08/04/11  2:04 AM      Component Value Range   Sodium 135  135 - 145 (mEq/L)   Potassium 3.3 (*) 3.5 - 5.1 (mEq/L)   Chloride 103  96 - 112 (mEq/L)   CO2 23  19 - 32 (mEq/L)   Glucose, Bld 92  70 - 99 (mg/dL)   BUN 5 (*) 6 -  23 (mg/dL)   Creatinine, Ser 9.14  0.50 - 1.10 (mg/dL)   Calcium 9.0  8.4 - 78.2 (mg/dL)   Total Protein 6.1  6.0 - 8.3 (g/dL)   Albumin 2.3 (*) 3.5 - 5.2 (g/dL)   AST 11  0 - 37 (U/L)   ALT 6  0 - 35 (U/L)   Alkaline Phosphatase 93  39 - 117 (U/L)   Total Bilirubin 0.2 (*) 0.3 - 1.2 (mg/dL)   GFR calc non Af Amer >60  >60 (mL/min)   GFR calc Af Amer >60  >60 (mL/min)  URIC ACID   Collection Time   08/04/11  2:04 AM      Component Value Range   Uric Acid, Serum 6.8  2.4 - 7.0 (mg/dL)   MD Consult: Discussed patient with Dr. Tamela Oddi. Will assess labs, BPs, and give procardia q 20 mins prn to max 3 doses for contractions Dr. Tamela Oddi updated, d/c home with 24 hour urine. FU in office on Monday  ED Course: cervix recheck, unchanged FT/Thick/post/vtx/-2  Assessment: Gestational Hypertension Braxton Hicks Contractions  Plan: Discharge home 24 hour urine Pre-x and PTL precautions FU at [facility] on Monday  Nazanin Kinner E. 08/04/2011,4:06 AM

## 2011-08-04 NOTE — Progress Notes (Signed)
G2P1 at 33.5wks. Seen MAU last pm with same symptoms. H/a, feet swelling, abdominal cramping. Hx preeclampsia with first del. And induced at 35wks. Also short cervix with first preg and on bedrest starting 25wks

## 2011-08-04 NOTE — Progress Notes (Signed)
Pt reports cramping "off and on" all day. Pt also reports lightheadedness and dizziness. Pt states that that she took tylenol for a headache at noon and it was relieved, but now it is back.

## 2011-08-04 NOTE — ED Provider Notes (Signed)
Chief Complaint: Abdominal Cramping, Headache, Leg Swelling and Dizziness   [name] is 25 y.o. G2P0101. No LMP recorded. Patient is pregnant.. [redacted]w[redacted]d She presents complaining of Abdominal Cramping, Headache, Leg Swelling and Dizziness. Pt presented to MAU last night with same symptoms. Reports taking tylenol at noon today for HA, states HA resolved but came back again this evening. Continues to reports lower ext swelling. Denies visual disturbance, epigastric pain, nausea, or vomiting. Reports good FM and occassional contractions.   Obstetrical/Gynecological History:  OB History    Grav  Para  Term  Preterm  Abortions  TAB  SAB  Ect  Mult  Living    2  1  0  1  0  0  0  0  0  1      Past Medical History:  Past Medical History   Diagnosis  Date   .  No pertinent past medical history    .  Gestational diabetes     Past Surgical History:  Past Surgical History   Procedure  Date   .  Wisdom tooth extraction  2007    Family History:  No family history on file.  Social History:  History   Substance Use Topics   .  Smoking status:  Never Smoker   .  Smokeless tobacco:  Never Used   .  Alcohol Use:  No    Allergies: No Known Allergies  Prescriptions prior to admission   Medication  Sig  Dispense  Refill   .  acetaminophen (TYLENOL) 500 MG tablet  Take 500 mg by mouth every 6 (six) hours as needed. For pain     .  prenatal vitamin w/FE, FA (PRENATAL 1 + 1) 27-1 MG TABS  Take 1 tablet by mouth daily.      Review of Systems - Negative except what has been reviewed in the HPI  Physical Exam   .vi General: General appearance - alert, well appearing, and in no distress and overweight  Mental status - alert, oriented to person, place, and time, normal mood, behavior, speech, dress, motor activity, and thought processes, affect appropriate to mood  Chest - clear to auscultation, no wheezes, rales or rhonchi, symmetric air entry Heart: RRR, w/o murmurs or bruits  Abdomen - Gravid,  nontender  Neurological - alert, oriented, normal speech, no focal findings or movement disorder noted, DTR's normal and symmetric  Extremities - pedal edema 1+ FHR: 140, mod variability, + 15x15, no decels Toco: no ctx  MD Consult: discussed patient with Dr. Tamela Oddi, agrees with plan. Pt to continue 24 hour urine, FU Monday as planned  Assessment:  Gestational Hypertension Fetal Testing c/w [facility]  Plan:   Discharge home  Continue 24 hours urine FU at [facility] on Monday as planned Pre-x precautions

## 2011-08-04 NOTE — Progress Notes (Signed)
Looking down at feet and noticed how swollen they were and remembered symptoms of preeclampsia that I had with my first daughter and though I needed to get it checked.  I had my first daughter at 82 weeks because of preeclampsia

## 2011-08-06 ENCOUNTER — Encounter: Payer: Self-pay | Admitting: Maternal and Fetal Medicine

## 2011-08-10 ENCOUNTER — Inpatient Hospital Stay (HOSPITAL_COMMUNITY)
Admission: AD | Admit: 2011-08-10 | Discharge: 2011-08-16 | DRG: 775 | Disposition: A | Discharge status: Home / Self Care | Source: Ambulatory Visit | Attending: Obstetrics & Gynecology | Admitting: Obstetrics & Gynecology

## 2011-08-10 ENCOUNTER — Encounter (HOSPITAL_COMMUNITY): Payer: Self-pay | Admitting: *Deleted

## 2011-08-10 DIAGNOSIS — O99814 Abnormal glucose complicating childbirth: Secondary | ICD-10-CM | POA: Diagnosis present

## 2011-08-10 DIAGNOSIS — O1490 Unspecified pre-eclampsia, unspecified trimester: Secondary | ICD-10-CM

## 2011-08-10 DIAGNOSIS — O9903 Anemia complicating the puerperium: Secondary | ICD-10-CM | POA: Diagnosis not present

## 2011-08-10 DIAGNOSIS — O139 Gestational [pregnancy-induced] hypertension without significant proteinuria, unspecified trimester: Principal | ICD-10-CM | POA: Diagnosis present

## 2011-08-10 DIAGNOSIS — D649 Anemia, unspecified: Secondary | ICD-10-CM | POA: Diagnosis not present

## 2011-08-10 DIAGNOSIS — A6000 Herpesviral infection of urogenital system, unspecified: Secondary | ICD-10-CM | POA: Insufficient documentation

## 2011-08-10 DIAGNOSIS — O24419 Gestational diabetes mellitus in pregnancy, unspecified control: Secondary | ICD-10-CM

## 2011-08-10 HISTORY — DX: Gestational (pregnancy-induced) hypertension without significant proteinuria, unspecified trimester: O13.9

## 2011-08-10 LAB — URINALYSIS, ROUTINE W REFLEX MICROSCOPIC
Bilirubin Urine: NEGATIVE
Glucose, UA: NEGATIVE mg/dL
Specific Gravity, Urine: 1.015 (ref 1.005–1.030)
Urobilinogen, UA: 0.2 mg/dL (ref 0.0–1.0)
pH: 6.5 (ref 5.0–8.0)

## 2011-08-10 LAB — COMPREHENSIVE METABOLIC PANEL
ALT: 12 U/L (ref 0–35)
AST: 22 U/L (ref 0–37)
Alkaline Phosphatase: 113 U/L (ref 39–117)
CO2: 23 mEq/L (ref 19–32)
Calcium: 8.2 mg/dL — ABNORMAL LOW (ref 8.4–10.5)
GFR calc non Af Amer: >60 mL/min (ref >60)
Potassium: 3.3 mEq/L — ABNORMAL LOW (ref 3.5–5.1)
Sodium: 137 mEq/L (ref 135–145)
Total Protein: 5.9 g/dL — ABNORMAL LOW (ref 6.0–8.3)

## 2011-08-10 LAB — URINE MICROSCOPIC-ADD ON

## 2011-08-10 LAB — CBC
MCH: 25.4 pg — ABNORMAL LOW (ref 26.0–34.0)
Platelets: 163 10*3/uL (ref 150–400)
RBC: 3.5 MIL/uL — ABNORMAL LOW (ref 3.87–5.11)

## 2011-08-10 LAB — PROTEIN / CREATININE RATIO, URINE
Creatinine, Urine: 115.87 mg/dL
Total Protein, Urine: 111.4 mg/dL

## 2011-08-10 MED ORDER — MAGNESIUM SULFATE BOLUS VIA INFUSION
4.0000 g | Freq: Once | INTRAVENOUS | Status: AC
  Administered 2011-08-10: 4 g via INTRAVENOUS
  Filled 2011-08-10: qty 500

## 2011-08-10 MED ORDER — PRENATAL PLUS 27-1 MG PO TABS
1.0000 | ORAL_TABLET | Freq: Every day | ORAL | Status: DC
  Administered 2011-08-11 – 2011-08-13 (×3): 1 via ORAL
  Filled 2011-08-10 (×3): qty 1

## 2011-08-10 MED ORDER — MAGNESIUM SULFATE 40 G IN LACTATED RINGERS - SIMPLE
1.0000 g/h | INTRAVENOUS | Status: DC
  Administered 2011-08-11 (×2): 1 g/h
  Filled 2011-08-10 (×2): qty 500

## 2011-08-10 MED ORDER — ACETAMINOPHEN 325 MG PO TABS: 650.0000 mg | ORAL_TABLET | ORAL | Status: DC | PRN

## 2011-08-10 MED ORDER — DOCUSATE SODIUM 100 MG PO CAPS
100.0000 mg | ORAL_CAPSULE | Freq: Every day | ORAL | Status: DC
  Administered 2011-08-11 – 2011-08-13 (×3): 100 mg via ORAL
  Filled 2011-08-10 (×3): qty 1

## 2011-08-10 MED ORDER — POTASSIUM CHLORIDE 2 MEQ/ML IV SOLN
INTRAVENOUS | Status: DC
  Administered 2011-08-10 – 2011-08-13 (×7): via INTRAVENOUS
  Filled 2011-08-10 (×11): qty 1000

## 2011-08-10 MED ORDER — BETAMETHASONE SOD PHOS & ACET 6 (3-3) MG/ML IJ SUSP
12.0000 mg | Freq: Once | INTRAMUSCULAR | Status: AC
  Administered 2011-08-10: 12 mg via INTRAMUSCULAR
  Filled 2011-08-10: qty 2

## 2011-08-10 MED ORDER — ZOLPIDEM TARTRATE 10 MG PO TABS
10.0000 mg | ORAL_TABLET | Freq: Every evening | ORAL | Status: DC | PRN
  Administered 2011-08-11 – 2011-08-12 (×2): 10 mg via ORAL
  Filled 2011-08-10 (×2): qty 1

## 2011-08-10 MED ORDER — LACTATED RINGERS IV SOLN
INTRAVENOUS | Status: DC
  Administered 2011-08-10: 22:00:00 via INTRAVENOUS

## 2011-08-10 MED ORDER — CALCIUM CARBONATE ANTACID 500 MG PO CHEW
2.0000 | CHEWABLE_TABLET | ORAL | Status: DC | PRN
  Administered 2011-08-13: 400 mg via ORAL
  Filled 2011-08-10: qty 2

## 2011-08-10 NOTE — Progress Notes (Signed)
This note also relates to the following rows which could not be included: BP - Cannot attach notes to rows marked as read only Pulse Rate - Cannot attach notes to rows marked as read only  08/10/11 2300  Provider Notification  Provider Name/Title Mliss Fritz, MD  Method of Notification Phone  Request Evaluate - remote  Notification Reason Order request;Lab/diagnostic study results   Dr. Gaynell Face notified of pt being GDM - diet controlled and Potassium of 3.3. Orders received for CBG monitoring and to change IV maintenance fluids.

## 2011-08-10 NOTE — Progress Notes (Signed)
BMZ - 1st dose given in left gluteal

## 2011-08-10 NOTE — Progress Notes (Signed)
Elevation in BP since last night, shortness of breath can't lay down or sit up, no position improves shortness of breath, nausea, diarrhea earlier.

## 2011-08-10 NOTE — Progress Notes (Signed)
TO RM 151 VIA W/C

## 2011-08-10 NOTE — ED Provider Notes (Signed)
History     Chief Complaint  Patient presents with  . Hypertension  . Nausea  . Diarrhea  . Shortness of Breath   HPI [name] is a 25 yo G2P0101 at 34.2 weeks who presents with SOB that started Wednesday that is getting worse.  Laying down on side makes the SOB worse.  Sitting up makes the SOB better.  Denies history of asthma.  Does have chest pain with trying to catch her breath.  Dry cough.  No fevers, chills, vomiting.  Mild rhinorrhea.  Watery diarrhea since yesterday.  Having increased LE edema.  Denies headache, vision changes, abdominal pain.  OB History    Grav Para Term Preterm Abortions TAB SAB Ect Mult Living   2 1 0 1 0 0 0 0 0 1       Past Medical History  Diagnosis Date  . Gestational diabetes   . Genital herpes     Past Surgical History  Procedure Date  . Wisdom tooth extraction 2007  . Tooth extraction     Family History  Problem Relation Age of Onset  . Diabetes Maternal Grandmother     History  Substance Use Topics  . Smoking status: Never Smoker   . Smokeless tobacco: Never Used  . Alcohol Use: No    Allergies: No Known Allergies  Prescriptions prior to admission  Medication Sig Dispense Refill  . prenatal vitamin w/FE, FA (PRENATAL 1 + 1) 27-1 MG TABS Take 1 tablet by mouth daily.        Marland Kitchen acetaminophen (TYLENOL) 500 MG tablet Take 500 mg by mouth every 6 (six) hours as needed. For pain       . butalbital-acetaminophen-caffeine (FIORICET) 50-325-40 MG per tablet Take 1-2 tablets by mouth every 6 (six) hours as needed for headache.  20 tablet  0    Review of Systems  All other systems reviewed and are negative.   Physical Exam   Blood pressure 148/95, pulse 73, temperature 98.9 F (37.2 C), temperature source Oral, resp. rate 18, height 5\' 3"  (1.6 m), weight 88.996 kg (196 lb 3.2 oz), SpO2 100.00%.  Serial BPs were done with lowest value being 141/90.  Physical Exam  Constitutional: She appears well-developed and  well-nourished.  HENT:  Head: Normocephalic and atraumatic.  Eyes: Pupils are equal, round, and reactive to light.  Neck: Normal range of motion. Neck supple.  Cardiovascular: Normal rate and regular rhythm.   Respiratory: Effort normal and breath sounds normal. No respiratory distress. She has no wheezes. She has no rales. She exhibits no tenderness.  GI: Soft. Bowel sounds are normal. She exhibits no distension and no mass. There is no tenderness. There is no rebound and no guarding.   Urine dipstick shows positive for protein of 100.   Lab Results  Component Value Date   WBC 6.5 08/10/2011   HGB 8.9* 08/10/2011   HCT 28.2* 08/10/2011   MCV 80.6 08/10/2011   PLT 163 08/10/2011   Lab Results  Component Value Date   CREATININE 0.67 08/10/2011   BUN 3* 08/10/2011   NA 137 08/10/2011   K 3.3* 08/10/2011   CL 104 08/10/2011   CO2 23 08/10/2011   Uric acid - 6.7 LD - 267   MAU Course  Procedures   Assessment and Plan  1.  Preeclampsia. Discussed pt with Dr Gaynell Face, who agree with admission and starting magnesium Sulfate - 4g loading and 2g continuous.  Will also obtain 24 hour protein and creatinine and  repeat labs in am.  Will obtain MFM consult in AM.  Criss Bartles JEHIEL 08/10/2011, 5:43 PM

## 2011-08-10 NOTE — Progress Notes (Signed)
UP TO B-ROOM-    START 24 HR URINE COLLECTION  WITH NEXT VOID.

## 2011-08-11 LAB — CBC
Hemoglobin: 8.2 g/dL — ABNORMAL LOW (ref 12.0–15.0)
MCH: 25.5 pg — ABNORMAL LOW (ref 26.0–34.0)
MCV: 80.1 fL (ref 78.0–100.0)
Platelets: 166 10*3/uL (ref 150–400)
RBC: 3.21 MIL/uL — ABNORMAL LOW (ref 3.87–5.11)
WBC: 7.2 10*3/uL (ref 4.0–10.5)

## 2011-08-11 LAB — CREATININE, URINE, 24 HOUR
Creatinine, 24H Ur: 1381 mg/d (ref 700–1800)
Urine Total Volume-UCRE24: 2800 mL

## 2011-08-11 LAB — GLUCOSE, CAPILLARY
Glucose-Capillary: 69 mg/dL — ABNORMAL LOW (ref 70–99)
Glucose-Capillary: 142 mg/dL — ABNORMAL HIGH (ref 70–99)
Glucose-Capillary: 114 mg/dL — ABNORMAL HIGH (ref 70–99)

## 2011-08-11 MED ORDER — BETAMETHASONE SOD PHOS & ACET 6 (3-3) MG/ML IJ SUSP
12.0000 mg | Freq: Once | INTRAMUSCULAR | Status: AC
  Administered 2011-08-11: 12 mg via INTRAMUSCULAR
  Filled 2011-08-11: qty 2

## 2011-08-11 MED ORDER — MAGNESIUM SULFATE 40 G IN LACTATED RINGERS - SIMPLE
2.0000 g/h | INTRAVENOUS | Status: AC
  Administered 2011-08-12: 2 g/h via INTRAVENOUS
  Filled 2011-08-11: qty 500

## 2011-08-11 NOTE — Progress Notes (Signed)
RN to the bedside, pulse ox sensor placed on right index finger - maternal pulse 90's. Decrease in FHR to 70's (for est. 10 sec.) with a return to baseline of 120's - no intervention required. Pt. In SF position.   RN will continue to monitor.

## 2011-08-11 NOTE — Plan of Care (Signed)
Problem: Consults Goal: Birthing Suites Patient Information Press F2 to bring up selections list  Pt < [redacted] weeks EGA and Diabetic         

## 2011-08-11 NOTE — Progress Notes (Signed)
RN to the bedside to adjust EFM - FHR -130 bpm.

## 2011-08-11 NOTE — H&P (Signed)
This is Dr. Francoise Ceo dictating the history and physical on  [name] She's a 25 year old gravida 2 para 0101 who is admitted because of PIH Her EDC is 09/19/2011 and she came to the hospital because her blood pressure was elevated at home Since she's been here diastolics range between 8 is in the 90s her urine protein was 1+ Labs normal platelets 161 She is undergoing a 24 urine collection at this time for protein and creatinine She's also on magnesium sulfate 4 g loading and 2 g per hour She also has occasional contractions Past medical history with her previous pregnancy she was hospitalized 10 weeks because of hypertension Past surgical history negative Social history negative System review negative Physical exam Well-developed female in no distress HEENT negative Lungs clear Heart regular rhythm no murmurs no gallops Abdomen 34 week size Pelvic deferred Extremities negative and

## 2011-08-11 NOTE — Progress Notes (Signed)
RN to the bedside, assist pt to BR for void.

## 2011-08-12 LAB — CBC
HCT: 25.5 % — ABNORMAL LOW (ref 36.0–46.0)
Hemoglobin: 8.2 g/dL — ABNORMAL LOW (ref 12.0–15.0)
MCHC: 32.2 g/dL (ref 30.0–36.0)
RBC: 3.12 MIL/uL — ABNORMAL LOW (ref 3.87–5.11)

## 2011-08-12 LAB — COMPREHENSIVE METABOLIC PANEL
ALT: 11 U/L (ref 0–35)
Alkaline Phosphatase: 95 U/L (ref 39–117)
BUN: 5 mg/dL — ABNORMAL LOW (ref 6–23)
CO2: 21 mEq/L (ref 19–32)
Chloride: 106 mEq/L (ref 96–112)
GFR calc Af Amer: >60 mL/min (ref >60)
GFR calc non Af Amer: >60 mL/min (ref >60)
Glucose, Bld: 126 mg/dL — ABNORMAL HIGH (ref 70–99)
Potassium: 4.3 mEq/L (ref 3.5–5.1)
Sodium: 136 mEq/L (ref 135–145)
Total Bilirubin: 0.2 mg/dL — ABNORMAL LOW (ref 0.3–1.2)
Total Protein: 5.8 g/dL — ABNORMAL LOW (ref 6.0–8.3)

## 2011-08-12 LAB — GLUCOSE, CAPILLARY
Glucose-Capillary: 123 mg/dL — ABNORMAL HIGH (ref 70–99)
Glucose-Capillary: 105 mg/dL — ABNORMAL HIGH (ref 70–99)

## 2011-08-12 MED ORDER — METHYLDOPA 500 MG PO TABS
500.0000 mg | ORAL_TABLET | Freq: Three times a day (TID) | ORAL | Status: DC
  Administered 2011-08-12 – 2011-08-13 (×4): 500 mg via ORAL
  Filled 2011-08-12 (×5): qty 1

## 2011-08-12 NOTE — Progress Notes (Signed)
Pt off monitor - to shower for a.m. ADLs. Fetal heart rate 140's.

## 2011-08-12 NOTE — Progress Notes (Signed)
  Diastolic blood pressures range between 80 and 98 24 protein 1300 mg patient is a chronic hypertensive To be seen by MFM on Monday and to have ultrasound at that time Magnesium was discontinued at 6 PM today patient will be started On Aldomet 500 by mouth q. 8 hours starting this a.m. She has no complaints

## 2011-08-13 ENCOUNTER — Inpatient Hospital Stay (HOSPITAL_COMMUNITY)

## 2011-08-13 DIAGNOSIS — O24419 Gestational diabetes mellitus in pregnancy, unspecified control: Secondary | ICD-10-CM | POA: Diagnosis present

## 2011-08-13 LAB — COMPREHENSIVE METABOLIC PANEL
Albumin: 2.3 g/dL — ABNORMAL LOW (ref 3.5–5.2)
BUN: 9 mg/dL (ref 6–23)
Calcium: 8.5 mg/dL (ref 8.4–10.5)
Creatinine, Ser: 0.66 mg/dL (ref 0.50–1.10)
GFR calc Af Amer: >60 mL/min (ref >60)
Glucose, Bld: 79 mg/dL (ref 70–99)
Total Protein: 5.9 g/dL — ABNORMAL LOW (ref 6.0–8.3)

## 2011-08-13 LAB — CBC
HCT: 25.2 % — ABNORMAL LOW (ref 36.0–46.0)
Hemoglobin: 7.9 g/dL — ABNORMAL LOW (ref 12.0–15.0)
MCHC: 31.3 g/dL (ref 30.0–36.0)
MCV: 82.4 fL (ref 78.0–100.0)
RDW: 16.1 % — ABNORMAL HIGH (ref 11.5–15.5)

## 2011-08-13 LAB — GLUCOSE, CAPILLARY: Glucose-Capillary: 80 mg/dL (ref 70–99)

## 2011-08-13 LAB — PROTEIN, URINE, 24 HOUR
Collection Interval-UPROT: 24 hours
Urine Total Volume-UPROT: 2800 mL

## 2011-08-13 LAB — URIC ACID: Uric Acid, Serum: 6.7 mg/dL (ref 2.4–7.0)

## 2011-08-13 MED ORDER — MAGNESIUM SULFATE 40 G IN LACTATED RINGERS - SIMPLE
2.0000 g/h | INTRAVENOUS | Status: DC
  Administered 2011-08-13: 2 g/h via INTRAVENOUS
  Filled 2011-08-13 (×2): qty 500

## 2011-08-13 MED ORDER — FERROUS SULFATE 325 (65 FE) MG PO TABS
325.0000 mg | ORAL_TABLET | Freq: Two times a day (BID) | ORAL | Status: DC
  Administered 2011-08-13 (×2): 325 mg via ORAL
  Filled 2011-08-13 (×2): qty 1

## 2011-08-13 MED ORDER — CITRIC ACID-SODIUM CITRATE 334-500 MG/5ML PO SOLN: 30.0000 mL | ORAL | Status: DC | PRN

## 2011-08-13 MED ORDER — OXYTOCIN 20 UNITS IN LACTATED RINGERS INFUSION - SIMPLE
125.0000 mL/h | Freq: Once | INTRAVENOUS | Status: AC
  Administered 2011-08-14: 999 mL/h via INTRAVENOUS

## 2011-08-13 MED ORDER — LABETALOL HCL 5 MG/ML IV SOLN
INTRAVENOUS | Status: AC
  Administered 2011-08-13: 20 mg via INTRAVENOUS
  Filled 2011-08-13: qty 4

## 2011-08-13 MED ORDER — ACETAMINOPHEN 325 MG PO TABS: 650.0000 mg | ORAL_TABLET | ORAL | Status: DC | PRN

## 2011-08-13 MED ORDER — LABETALOL HCL 5 MG/ML IV SOLN
20.0000 mg | Freq: Once | INTRAVENOUS | Status: AC
  Administered 2011-08-13: 20 mg via INTRAVENOUS

## 2011-08-13 MED ORDER — FUROSEMIDE 10 MG/ML IJ SOLN
10.0000 mg | Freq: Once | INTRAMUSCULAR | Status: AC
  Administered 2011-08-13: 10 mg via INTRAVENOUS
  Filled 2011-08-13: qty 1

## 2011-08-13 MED ORDER — IBUPROFEN 600 MG PO TABS: 600.0000 mg | ORAL_TABLET | Freq: Four times a day (QID) | ORAL | Status: DC | PRN

## 2011-08-13 MED ORDER — OXYCODONE-ACETAMINOPHEN 5-325 MG PO TABS: 2.0000 | ORAL_TABLET | ORAL | Status: DC | PRN

## 2011-08-13 MED ORDER — LACTATED RINGERS IV SOLN
INTRAVENOUS | Status: DC
  Administered 2011-08-14: 75 mL/h via INTRAVENOUS
  Administered 2011-08-14: 999 mL/h via INTRAVENOUS

## 2011-08-13 MED ORDER — MISOPROSTOL 25 MCG QUARTER TABLET
25.0000 ug | ORAL_TABLET | ORAL | Status: DC | PRN
  Administered 2011-08-13 – 2011-08-14 (×2): 25 ug via VAGINAL
  Filled 2011-08-13: qty 1
  Filled 2011-08-13 (×2): qty 0.25

## 2011-08-13 MED ORDER — PENICILLIN G POTASSIUM 5000000 UNITS IJ SOLR
5.0000 10*6.[IU] | Freq: Once | INTRAVENOUS | Status: AC
  Administered 2011-08-13: 5 10*6.[IU] via INTRAVENOUS
  Filled 2011-08-13: qty 5

## 2011-08-13 MED ORDER — FLEET ENEMA 7-19 GM/118ML RE ENEM: 1.0000 | ENEMA | RECTAL | Status: DC | PRN

## 2011-08-13 MED ORDER — TERBUTALINE SULFATE 1 MG/ML IJ SOLN: 0.2500 mg | Freq: Once | INTRAMUSCULAR | Status: AC | PRN

## 2011-08-13 MED ORDER — OXYTOCIN BOLUS FROM INFUSION
500.0000 mL | Freq: Once | INTRAVENOUS | Status: DC
  Filled 2011-08-13: qty 500
  Filled 2011-08-13: qty 1000

## 2011-08-13 MED ORDER — MAGNESIUM SULFATE BOLUS VIA INFUSION
4.0000 g | Freq: Once | INTRAVENOUS | Status: AC
  Administered 2011-08-13: 4 g via INTRAVENOUS
  Filled 2011-08-13: qty 500

## 2011-08-13 MED ORDER — LACTATED RINGERS IV SOLN: 500.0000 mL | INTRAVENOUS | Status: DC | PRN

## 2011-08-13 MED ORDER — ONDANSETRON HCL 4 MG/2ML IJ SOLN: 4.0000 mg | Freq: Four times a day (QID) | INTRAMUSCULAR | Status: DC | PRN

## 2011-08-13 MED ORDER — PENICILLIN G POTASSIUM 5000000 UNITS IJ SOLR
2.5000 10*6.[IU] | INTRAVENOUS | Status: DC
  Administered 2011-08-14: 2.5 10*6.[IU] via INTRAVENOUS
  Filled 2011-08-13 (×4): qty 2.5

## 2011-08-13 MED ORDER — LIDOCAINE HCL (PF) 1 % IJ SOLN
30.0000 mL | INTRAMUSCULAR | Status: DC | PRN
  Filled 2011-08-13 (×2): qty 30

## 2011-08-13 NOTE — Progress Notes (Signed)
Dr. Tamela Oddi called. Update given and discussed plan of care. MD gave orders for induction of labor

## 2011-08-13 NOTE — Progress Notes (Signed)
UR chart review completed.  

## 2011-08-13 NOTE — Progress Notes (Signed)
Admitted at  [redacted] weeks gestation, with preeclampsia and GDM(diet controlled).  Height  63" Weight 196 lbs pre-pregnancy weight 162 lbs.Pre-pregnancy  BMI 27.4 (overweight)  IBW 115 lbs  Total weight gain 34 lbs. Weight gain goals15-25 lbs.   Estimated needs: 18-2000 kcal/day, 63-73 grams protein/day, 2 liters fluid/day Carbohydrate modified diet tolerated well, appetite good.  nutrition related labs: CBG's 9/16 - 9/17 80-155, Hct 25% (on FeSo4 325 mg bid and PNV w/ iron)  Pt on CHO mod diet designed for pts that are not pregnant, and allows foods typically eliminated on the GDM diet. Given that some of Pts. CBG's are slightly elevated, the CHO mod Gestational diet may promote better glucose control Pt never received diet education on GDM diet. Briefly reviewed diet and gave pt a copy. Instructions on how to order snacks on GDM diet also given to pt.  Nutrition Dx: Impaired CHO utilization r/t pregnancy and dx of GDM aeb elevated CBG's .  No educational needs assessed at this time.

## 2011-08-13 NOTE — Progress Notes (Signed)
  S: Preterm labor symptoms: UCs/SOB decreased  O: Blood pressure 137/94, pulse 81, temperature 98.3 F (36.8 C), temperature source Oral, resp. rate 20, height 5\' 3"  (1.6 m), weight 88.996 kg (196 lb 3.2 oz), SpO2 97.00%.  CXR/ I/O reviewed   FHT: reassuring Toco: irregular WGN:FAOZHYQM: Closed Effacement (%): Thick Cervical Position: Posterior Exam by:: s.cole,rn  A/P- 25 y.o. now severe preeclampsia/pulmonary edema Dating:  [redacted]w[redacted]d Unfavorable Bishop's score Transfer--> L&D for IOL Lasix IVP Continue MgSO4 Monitor I&O Maintain O2 sats >95% PCN for GBS prophylaxis  FWB:  Fetal heart tracing c/w FWB  ROD: SVD

## 2011-08-13 NOTE — Consult Note (Signed)
MATERNAL FETAL MEDICINE CONSULT  Patient Name: [name] Medical Record Number:  [id number] Date of Birth: 09/25/1986 Requesting Physician Name:  Roseanna Rainbow, MD Date of Service: 08/13/2011  Chief Complaint Preeclampsia, with history of prior pregnancy complicated by preeclampsia. Gestational diabetes HSV chronic infection  History of Present Illness [name] was seen today for prenatal diagnosis secondary to preeclampsiaat the request of Dr. Tamela Oddi.  The patient is a 25 y.o. G2P0101, with an EDD of 09/19/2011, Alternate EDD Entry dating method.  She had preeclampsia at the end of her last pregnancy, delivered at 35 weeks.  She denies any findings of hypertension between pregnancies or in this pregnancy up to a week ago.  She describes headaches without visual disturbance or abdominal pain.  Review of Systems A comprehensive review of systems was negative except WUJ:WJXB persistent headache. She denies visual signs and symptoms, abdominal pain, sensory complaints.  She describes improved lower extremity edema since admission on 9/14.   Patient History OB History    Grav Para Term Preterm Abortions TAB SAB Ect Mult Living   2 1 0 1 0 0 0 0 0 1      # Outc Date GA Lbr Len/2nd Wgt Sex Del Anes PTL Lv   1 PRE 2008 [redacted]w[redacted]d   F SVD EPI  Yes   Comments: Induced for preeclampsia @ 35 weeks, was on bedrest for shortened cervix also   2 CUR               Past Medical History  Diagnosis Date  . Gestational diabetes; diet controlled   . Genital herpes: dormant   . Preterm labor hsitory     1st baby delivered at 35 wks  . Pregnancy induced hypertension     with 1st pregnancy    Past Surgical History  Procedure Date  . Wisdom tooth extraction 2007  . Tooth extraction     History   Social History  . Marital Status: Married    Spouse Name: N/A    Number of Children: 1  . Years of Education: N/A   Social History Main Topics  . Smoking status:  Never Smoker   . Smokeless tobacco: Never Used  . Alcohol Use: No  . Drug Use: No  . Sexually Active: Not Currently     last interaction 02/2011    Other Topics Concern  . None   Social History Narrative  . None    Family History  Problem Relation Age of Onset  . Diabetes Maternal Grandmother    In addition, the patient has no family history of mental retardation, birth defects, or genetic diseases.  Physical Examination Filed Vitals:   08/13/11 1225  BP: 153/101; previously   Pulse: 71  Temp:   Resp: 20   Normal maternal respiratory and heart rates. No abdominal tenderness.  Pitting edema 1+ Reflexes 1-2/4. Ultrasound of fetus: Report in [facility].   Labs reviewed:  24 hour urine protein: 1.3 gm FBS and post prandial BS are generally normal, although some FBS are >95 mg/dL Normal renal, hepatic enzyme and platelet results. Urine outputs are normal.  CBC shows evidence of hypochromic normocytic anemia.   Assessment and Recommendations Preeclampsia;  1. Continue hospital monitoring of maternal and fetal condition; 2. Repeat renal, hepatic and platelet functions 1-2 times/week;  3. Daily NST's.  4. Weekly U/S reassessment of AFI. 5. Stop antihypertensive medications as they are of no proven benefit and may affect effective uterine blood flow.  6. Deliver if  evidence of worsening headaches, BP's persistently >160 mmHg systolic, >100 mmHg diastolic; persistent headaches or visual signs;  abnormal renal or hepatic function tests ( >2X Normal), platelet count <100K/uL; bleeding; abnormal fetal testing.  7. Deliver at 37 weeks if stable before then. Gestational diabetes:  1. Continue blood glucose testing at fasting and 2 hr PP 1. Continue hospital monitoring of maternal and fetal condition; 2. Continue CHO prudent diet. 3. Consider institution of NPH insulin 20 units/day if persistent evidence of fasting hyperglycemia. HSV: 1. Acycllovir or valacyclovir  prophylaxis until delivery. Anemia: 1. Continue iron replacement  2. If hemoglobin electrophoresis has never been performed, recommend same to establish presence or absence of sickle trait.  I spent 30 minutes in face to face consultation with the patient.  Thank you for the opportunity to provide consultation.   Finnleigh Marchetti,JOE

## 2011-08-13 NOTE — Progress Notes (Signed)
Pt transport to L& D via wheelchair. Report given to [facility]

## 2011-08-13 NOTE — Progress Notes (Signed)
  S: No complaints.  O: Blood pressure 135/86, pulse 76, temperature 98.1 F (36.7 C), temperature source Oral, resp. rate 18, height 5\' 3"  (1.6 m), weight 88.996 kg (196 lb 3.2 oz), SpO2 98.00%. I&O reviewed   ZOX:WRUEAVWU: 140 bpm, Variability: Good {> 6 bpm) and Accelerations: Reactive Toco: None SVE:   Deferred  A/P- 25 y.o. admitted with gestational diabetes and hypertension  Dating:  [redacted]w[redacted]d MFM consult IV-->NS heplock

## 2011-08-14 ENCOUNTER — Encounter (HOSPITAL_COMMUNITY): Payer: Self-pay

## 2011-08-14 LAB — RPR: RPR Ser Ql: NONREACTIVE

## 2011-08-14 LAB — COMPREHENSIVE METABOLIC PANEL
AST: 53 U/L — ABNORMAL HIGH (ref 0–37)
Albumin: 2.5 g/dL — ABNORMAL LOW (ref 3.5–5.2)
Alkaline Phosphatase: 114 U/L (ref 39–117)
BUN: 8 mg/dL (ref 6–23)
CO2: 24 mEq/L (ref 19–32)
Chloride: 101 mEq/L (ref 96–112)
GFR calc non Af Amer: >60 mL/min (ref >60)
Potassium: 4.2 mEq/L (ref 3.5–5.1)
Total Bilirubin: 0.3 mg/dL (ref 0.3–1.2)

## 2011-08-14 LAB — CBC
HCT: 30.8 % — ABNORMAL LOW (ref 36.0–46.0)
MCV: 81.9 fL (ref 78.0–100.0)
Platelets: 192 10*3/uL (ref 150–400)
RBC: 3.76 MIL/uL — ABNORMAL LOW (ref 3.87–5.11)
RDW: 16.1 % — ABNORMAL HIGH (ref 11.5–15.5)
WBC: 12 10*3/uL — ABNORMAL HIGH (ref 4.0–10.5)

## 2011-08-14 LAB — GLUCOSE, CAPILLARY: Glucose-Capillary: 82 mg/dL (ref 70–99)

## 2011-08-14 MED ORDER — LACTATED RINGERS IV SOLN
500.0000 mL | Freq: Once | INTRAVENOUS | Status: AC
  Administered 2011-08-14: 1000 mL via INTRAVENOUS

## 2011-08-14 MED ORDER — EPHEDRINE 5 MG/ML INJ
10.0000 mg | INTRAVENOUS | Status: DC | PRN
  Filled 2011-08-14: qty 4

## 2011-08-14 MED ORDER — SIMETHICONE 80 MG PO CHEW: 80.0000 mg | CHEWABLE_TABLET | ORAL | Status: DC | PRN

## 2011-08-14 MED ORDER — METHYLDOPA 500 MG PO TABS
500.0000 mg | ORAL_TABLET | Freq: Three times a day (TID) | ORAL | Status: DC
  Administered 2011-08-14 – 2011-08-16 (×6): 500 mg via ORAL
  Filled 2011-08-14 (×7): qty 1

## 2011-08-14 MED ORDER — ONDANSETRON HCL 4 MG PO TABS: 4.0000 mg | ORAL_TABLET | ORAL | Status: DC | PRN

## 2011-08-14 MED ORDER — ONDANSETRON HCL 4 MG/2ML IJ SOLN: 4.0000 mg | INTRAMUSCULAR | Status: DC | PRN

## 2011-08-14 MED ORDER — LACTATED RINGERS IV SOLN
INTRAVENOUS | Status: DC
  Administered 2011-08-14 – 2011-08-15 (×2): via INTRAVENOUS

## 2011-08-14 MED ORDER — METHYLDOPA 500 MG PO TABS
500.0000 mg | ORAL_TABLET | Freq: Three times a day (TID) | ORAL | Status: DC
  Administered 2011-08-14: 500 mg via ORAL
  Filled 2011-08-14: qty 1

## 2011-08-14 MED ORDER — IBUPROFEN 600 MG PO TABS
600.0000 mg | ORAL_TABLET | Freq: Four times a day (QID) | ORAL | Status: DC
  Administered 2011-08-14 – 2011-08-16 (×7): 600 mg via ORAL
  Filled 2011-08-14 (×7): qty 1

## 2011-08-14 MED ORDER — DIPHENHYDRAMINE HCL 50 MG/ML IJ SOLN: 12.5000 mg | INTRAMUSCULAR | Status: DC | PRN

## 2011-08-14 MED ORDER — MAGNESIUM SULFATE 40 G IN LACTATED RINGERS - SIMPLE
2.0000 g/h | INTRAVENOUS | Status: DC
  Filled 2011-08-14: qty 500

## 2011-08-14 MED ORDER — MAGNESIUM SULFATE 40 G IN LACTATED RINGERS - SIMPLE
2.0000 g/h | INTRAVENOUS | Status: DC
  Administered 2011-08-14 – 2011-08-15 (×2): 2 g/h via INTRAVENOUS
  Filled 2011-08-14: qty 500

## 2011-08-14 MED ORDER — FENTANYL 2.5 MCG/ML BUPIVACAINE 1/10 % EPIDURAL INFUSION (WH - ANES)
14.0000 mL/h | INTRAMUSCULAR | Status: DC
  Filled 2011-08-14: qty 60

## 2011-08-14 MED ORDER — DIBUCAINE 1 % RE OINT: 1.0000 "application " | TOPICAL_OINTMENT | RECTAL | Status: DC | PRN

## 2011-08-14 MED ORDER — DIPHENHYDRAMINE HCL 25 MG PO CAPS: 25.0000 mg | ORAL_CAPSULE | Freq: Four times a day (QID) | ORAL | Status: DC | PRN

## 2011-08-14 MED ORDER — SENNOSIDES-DOCUSATE SODIUM 8.6-50 MG PO TABS
2.0000 | ORAL_TABLET | Freq: Every day | ORAL | Status: DC
  Administered 2011-08-14 – 2011-08-15 (×2): 2 via ORAL

## 2011-08-14 MED ORDER — BENZOCAINE-MENTHOL 20-0.5 % EX AERO: 1.0000 "application " | INHALATION_SPRAY | CUTANEOUS | Status: DC | PRN

## 2011-08-14 MED ORDER — ZOLPIDEM TARTRATE 5 MG PO TABS: 5.0000 mg | ORAL_TABLET | Freq: Every evening | ORAL | Status: DC | PRN

## 2011-08-14 MED ORDER — PHENYLEPHRINE 40 MCG/ML (10ML) SYRINGE FOR IV PUSH (FOR BLOOD PRESSURE SUPPORT)
80.0000 ug | PREFILLED_SYRINGE | INTRAVENOUS | Status: DC | PRN
  Filled 2011-08-14: qty 5

## 2011-08-14 MED ORDER — OXYCODONE-ACETAMINOPHEN 5-325 MG PO TABS: 1.0000 | ORAL_TABLET | ORAL | Status: DC | PRN

## 2011-08-14 MED ORDER — PHENYLEPHRINE 40 MCG/ML (10ML) SYRINGE FOR IV PUSH (FOR BLOOD PRESSURE SUPPORT)
80.0000 ug | PREFILLED_SYRINGE | INTRAVENOUS | Status: DC | PRN
  Filled 2011-08-14 (×2): qty 5

## 2011-08-14 MED ORDER — EPHEDRINE 5 MG/ML INJ
10.0000 mg | INTRAVENOUS | Status: DC | PRN
  Filled 2011-08-14 (×2): qty 4

## 2011-08-14 MED ORDER — TETANUS-DIPHTH-ACELL PERTUSSIS 5-2.5-18.5 LF-MCG/0.5 IM SUSP
0.5000 mL | Freq: Once | INTRAMUSCULAR | Status: AC
  Administered 2011-08-15: 0.5 mL via INTRAMUSCULAR
  Filled 2011-08-14: qty 0.5

## 2011-08-14 MED ORDER — LANOLIN HYDROUS EX OINT: TOPICAL_OINTMENT | CUTANEOUS | Status: DC | PRN

## 2011-08-14 MED ORDER — WITCH HAZEL-GLYCERIN EX PADS: 1.0000 "application " | MEDICATED_PAD | CUTANEOUS | Status: DC | PRN

## 2011-08-14 MED ORDER — BUTALBITAL-APAP-CAFFEINE 50-325-40 MG PO TABS
1.0000 | ORAL_TABLET | Freq: Four times a day (QID) | ORAL | Status: DC | PRN
  Filled 2011-08-14: qty 2

## 2011-08-14 NOTE — Progress Notes (Signed)
[name] is a 25 y.o. G2P0201 at [redacted]w[redacted]d by LMP admitted for induction of labor due to severe PIH.  Subjective:   Objective: BP 136/99  Pulse 78  Temp(Src) 98.1 F (36.7 C) (Oral)  Resp 20  Ht 5\' 3"  (1.6 m)  Wt 89.994 kg (198 lb 6.4 oz)  BMI 35.15 kg/m2  SpO2 99%  Breastfeeding? Unknown I/O last 3 completed shifts: In: 4076 [P.O.:720; I.V.:3105; IV Piggyback:251] Out: 5900 [Urine:5900] Total I/O In: 1829.5 [P.O.:250; I.V.:1579.5] Out: 2950 [Urine:2950]  FHT:  FHR: 140 bpm, variability: moderate,  accelerations:  Present,  decelerations:  Absent UC:   irregular, every 7 minutes SVE:   3/80%/Vtx -2; AROM clear fluid/IUPC placed  Labs: Lab Results  Component Value Date   WBC 12.0* 08/14/2011   HGB 9.5* 08/14/2011   HCT 30.8* 08/14/2011   MCV 81.9 08/14/2011   PLT 192 08/14/2011    Assessment / Plan: Induction of labor due to preeclampsia,  progressing well on pitocin  Labor: Early labor Preeclampsia:  on magnesium sulfate and intake and ouput balanced Fetal Wellbeing:  Category I  I/D:  n/a Anticipated MOD:  NSVD  JACKSON-MOORE,[name] A 08/14/2011, 5:47 PM

## 2011-08-15 LAB — CBC
HCT: 26.6 % — ABNORMAL LOW (ref 36.0–46.0)
MCH: 25.4 pg — ABNORMAL LOW (ref 26.0–34.0)
MCHC: 31.2 g/dL (ref 30.0–36.0)
MCV: 81.3 fL (ref 78.0–100.0)
RDW: 16.3 % — ABNORMAL HIGH (ref 11.5–15.5)

## 2011-08-15 MED ORDER — FERROUS SULFATE 325 (65 FE) MG PO TABS
325.0000 mg | ORAL_TABLET | Freq: Two times a day (BID) | ORAL | Status: DC
  Administered 2011-08-15 – 2011-08-16 (×3): 325 mg via ORAL
  Filled 2011-08-15 (×3): qty 1

## 2011-08-15 NOTE — Progress Notes (Signed)
  Post Partum Day 1 S/P spontaneous vaginal RH status/Rubella reviewed.  Feeding: breast Subjective: No HA, SOB, CP, F/C, breast symptoms. Normal vaginal bleeding, no clots.     Objective: BP 148/81  Pulse 87  Temp(Src) 97.9 F (36.6 C) (Oral)  Resp 14  Ht 5\' 3"  (1.6 m)  Wt 86.183 kg (190 lb)  BMI 33.66 kg/m2  SpO2 95%  Breastfeeding? Unknown I&O reviewed.   Physical Exam:  General: alert Lochia: appropriate Uterine Fundus: firm DVT Evaluation: No evidence of DVT seen on physical exam. Ext: No c/c/e  Basename 08/15/11 0533 08/14/11 0455  HGB 8.3* 9.5*  HCT 26.6* 30.8*      Assessment/Plan: 25 y.o.  PPD #1 .  normal postpartum exam Diuresing/B/Ps OK Continue current postpartum care D/C Mg Ambulate   LOS: 5 days   JACKSON-MOORE,Omer Monter A 08/15/2011, 9:26 AM

## 2011-08-16 MED ORDER — METHYLDOPA 500 MG PO TABS: 500.0000 mg | ORAL_TABLET | Freq: Three times a day (TID) | ORAL | Status: DC

## 2011-08-16 MED ORDER — FERROUS SULFATE 325 (65 FE) MG PO TABS: 325.0000 mg | ORAL_TABLET | Freq: Two times a day (BID) | ORAL | Status: DC

## 2011-08-16 MED ORDER — IBUPROFEN 800 MG PO TABS: 800.0000 mg | ORAL_TABLET | Freq: Three times a day (TID) | ORAL | Status: AC | PRN

## 2011-08-16 NOTE — Progress Notes (Signed)
Post Partum Day #2 S/P:induced vaginal  RH status/Rubella reviewed.  Feeding: breast Subjective: No HA, SOB, CP, F/C, breast symptoms: No. Normal vaginal bleeding, no clots.     Objective:  Blood pressure 142/90, pulse 71, temperature 98.2 F (36.8 C), temperature source Oral, resp. rate 18, height 5\' 3"  (1.6 m), weight 83.008 kg (183 lb), SpO2 97.00%, unknown if currently breastfeeding.   Physical Exam:  General: alert Lochia: appropriate Uterine Fundus: firm DVT Evaluation: No evidence of DVT seen on physical exam. Ext: No c/c/e  Basename 08/15/11 0533 08/14/11 0455  HGB 8.3* 9.5*  HCT 26.6* 30.8*    Assessment/Plan: 25 y.o.  PPD # 2 .  normal postpartum exam B/Ps OK  Continue current postpartum care D/C home   LOS: 6 days   JACKSON-MOORE,Dioselina Brumbaugh A 08/16/2011, 9:53 AM

## 2011-08-16 NOTE — Discharge Summary (Signed)
Obstetric Discharge Summary Reason for Admission: observation/evaluation and PIH Prenatal Procedures: Preeclampsia and ultrasound Intrapartum Procedures: spontaneous vaginal delivery Postpartum Procedures: none Complications-Operative and Postpartum: none Hemoglobin  Date Value Range Status  08/15/2011 8.3* 12.0-15.0 (g/dL) Final     HCT  Date Value Range Status  08/15/2011 26.6* 36.0-46.0 (%) Final    Discharge Diagnoses: Preelampsia, Anemia  Discharge Information: Date: 08/16/2011 Activity: pelvic rest Diet: routine Medications: Ibuprophen, Iron and Aldomet Condition: stable Instructions: See above Discharge to: home Follow-up Information    Follow up with Roseanna Rainbow, MD. Call on 08/20/2011.   Contact information:   [address] [place] [address] [phone]          Newborn Data: Live born female  Birth Weight: 5 lb 0.2 oz (2274 g) APGAR: 8, 9    [name],[name] 08/16/2011, 10:00 AM

## 2011-08-16 NOTE — Progress Notes (Signed)
UR Chart review completed.  

## 2011-09-07 LAB — URINALYSIS, ROUTINE W REFLEX MICROSCOPIC
Bilirubin Urine: NEGATIVE
Ketones, ur: 15 — AB
Nitrite: NEGATIVE
Protein, ur: >300 — AB
Specific Gravity, Urine: >1.03 — ABNORMAL HIGH
Urobilinogen, UA: 0.2
Urobilinogen, UA: 0.2
pH: 6

## 2011-09-07 LAB — COMPREHENSIVE METABOLIC PANEL
ALT: 12
AST: 29
Albumin: 2.4 — ABNORMAL LOW
Albumin: 2.6 — ABNORMAL LOW
Alkaline Phosphatase: 71
Alkaline Phosphatase: 82
BUN: 3 — ABNORMAL LOW
BUN: 4 — ABNORMAL LOW
CO2: 23
Chloride: 105
Chloride: 106
Creatinine, Ser: 0.75
GFR calc non Af Amer: >60
Glucose, Bld: 74
Potassium: 3.7
Potassium: 3.8
Sodium: 135
Total Bilirubin: 0.4
Total Bilirubin: 0.5
Total Bilirubin: 0.8
Total Protein: 5.6 — ABNORMAL LOW
Total Protein: 6.2

## 2011-09-07 LAB — CBC
HCT: 28.2 — ABNORMAL LOW
HCT: 29.3 — ABNORMAL LOW
HCT: 29.7 — ABNORMAL LOW
Hemoglobin: 12.6
MCV: 89.1
MCV: 89.3
Platelets: 186
Platelets: 208
RBC: 3.16 — ABNORMAL LOW
RBC: 4.09
RBC: 4.09
RDW: 13.9
RDW: 14.5 — ABNORMAL HIGH
WBC: 7.4
WBC: 7.5
WBC: 8.1
WBC: 9.5

## 2011-09-07 LAB — URINE MICROSCOPIC-ADD ON

## 2011-09-07 LAB — LACTATE DEHYDROGENASE: LDH: 162

## 2011-09-07 LAB — URIC ACID
Uric Acid, Serum: 7.7 — ABNORMAL HIGH
Uric Acid, Serum: 8.5 — ABNORMAL HIGH

## 2011-09-07 LAB — RAPID HIV SCREEN (WH-MAU): Rapid HIV Screen: NONREACTIVE

## 2011-09-11 LAB — URINALYSIS, ROUTINE W REFLEX MICROSCOPIC
Bilirubin Urine: NEGATIVE
Glucose, UA: NEGATIVE
Ketones, ur: NEGATIVE
Protein, ur: NEGATIVE
pH: 7

## 2011-09-11 LAB — GLUCOSE, 2 HOUR GESTATIONAL: Glucose Tolerance, 2 hour: 136

## 2011-09-11 LAB — GLUCOSE, 3 HOUR GESTATIONAL: Glucose, GTT - 3 Hour: 131

## 2011-09-11 LAB — MAGNESIUM: Magnesium: 4.5 — ABNORMAL HIGH

## 2011-09-11 LAB — GLUCOSE, FASTING GESTATIONAL: Glucose Tolerance, Fasting: 88

## 2011-09-12 LAB — URINALYSIS, MICROSCOPIC ONLY
Glucose, UA: NEGATIVE
Leukocytes, UA: NEGATIVE
Specific Gravity, Urine: >1.03 — ABNORMAL HIGH
pH: 6

## 2011-09-12 LAB — RPR: RPR Ser Ql: NONREACTIVE

## 2011-09-12 LAB — SAMPLE TO BLOOD BANK

## 2011-09-12 LAB — CBC
MCV: 88.5
Platelets: 258
WBC: 10

## 2011-09-13 LAB — URINALYSIS, DIPSTICK ONLY
Bilirubin Urine: NEGATIVE
Ketones, ur: NEGATIVE
Nitrite: NEGATIVE
Specific Gravity, Urine: 1.025
Urobilinogen, UA: 1

## 2011-09-13 LAB — CBC
Hemoglobin: 12.6
MCHC: 33.4
RDW: 13.8

## 2011-09-13 LAB — WET PREP, GENITAL: Trich, Wet Prep: NONE SEEN

## 2012-08-16 ENCOUNTER — Encounter (HOSPITAL_BASED_OUTPATIENT_CLINIC_OR_DEPARTMENT_OTHER): Payer: Self-pay | Admitting: Family Medicine

## 2012-08-16 ENCOUNTER — Emergency Department (HOSPITAL_BASED_OUTPATIENT_CLINIC_OR_DEPARTMENT_OTHER)

## 2012-08-16 ENCOUNTER — Emergency Department (HOSPITAL_BASED_OUTPATIENT_CLINIC_OR_DEPARTMENT_OTHER)
Admission: EM | Admit: 2012-08-16 | Discharge: 2012-08-16 | Disposition: A | Discharge status: Home / Self Care | Attending: Emergency Medicine | Admitting: Emergency Medicine

## 2012-08-16 DIAGNOSIS — Z3490 Encounter for supervision of normal pregnancy, unspecified, unspecified trimester: Secondary | ICD-10-CM

## 2012-08-16 DIAGNOSIS — B9689 Other specified bacterial agents as the cause of diseases classified elsewhere: Secondary | ICD-10-CM | POA: Insufficient documentation

## 2012-08-16 DIAGNOSIS — A499 Bacterial infection, unspecified: Secondary | ICD-10-CM | POA: Insufficient documentation

## 2012-08-16 DIAGNOSIS — N760 Acute vaginitis: Secondary | ICD-10-CM | POA: Insufficient documentation

## 2012-08-16 DIAGNOSIS — O2390 Unspecified genitourinary tract infection in pregnancy, unspecified trimester: Secondary | ICD-10-CM | POA: Insufficient documentation

## 2012-08-16 DIAGNOSIS — O210 Mild hyperemesis gravidarum: Secondary | ICD-10-CM | POA: Insufficient documentation

## 2012-08-16 LAB — WET PREP, GENITAL
Trich, Wet Prep: NONE SEEN
Yeast Wet Prep HPF POC: NONE SEEN

## 2012-08-16 LAB — CBC WITH DIFFERENTIAL/PLATELET
Basophils Absolute: 0 10*3/uL (ref 0.0–0.1)
Eosinophils Relative: 1 % (ref 0–5)
Lymphocytes Relative: 27 % (ref 12–46)
Lymphs Abs: 1.4 10*3/uL (ref 0.7–4.0)
MCV: 79.2 fL (ref 78.0–100.0)
Neutro Abs: 3.1 10*3/uL (ref 1.7–7.7)
Neutrophils Relative %: 59 % (ref 43–77)
Platelets: 261 10*3/uL (ref 150–400)
RBC: 4.47 MIL/uL (ref 3.87–5.11)
RDW: 15.6 % — ABNORMAL HIGH (ref 11.5–15.5)
WBC: 5.4 10*3/uL (ref 4.0–10.5)

## 2012-08-16 LAB — URINALYSIS, ROUTINE W REFLEX MICROSCOPIC
Glucose, UA: NEGATIVE mg/dL
Hgb urine dipstick: NEGATIVE
Protein, ur: 30 mg/dL — AB
Urobilinogen, UA: 1 mg/dL (ref 0.0–1.0)

## 2012-08-16 LAB — COMPREHENSIVE METABOLIC PANEL
ALT: 31 U/L (ref 0–35)
AST: 16 U/L (ref 0–37)
Alkaline Phosphatase: 66 U/L (ref 39–117)
CO2: 24 mEq/L (ref 19–32)
Chloride: 99 mEq/L (ref 96–112)
GFR calc Af Amer: >90 mL/min (ref >90)
GFR calc non Af Amer: >90 mL/min (ref >90)
Glucose, Bld: 102 mg/dL — ABNORMAL HIGH (ref 70–99)
Potassium: 4.1 mEq/L (ref 3.5–5.1)
Sodium: 135 mEq/L (ref 135–145)
Total Bilirubin: 0.2 mg/dL — ABNORMAL LOW (ref 0.3–1.2)

## 2012-08-16 LAB — URINE MICROSCOPIC-ADD ON

## 2012-08-16 LAB — ABO/RH

## 2012-08-16 LAB — PREGNANCY, URINE: Preg Test, Ur: POSITIVE — AB

## 2012-08-16 MED ORDER — METRONIDAZOLE 500 MG PO TABS: 500.0000 mg | ORAL_TABLET | Freq: Two times a day (BID) | ORAL | Status: DC

## 2012-08-16 MED ORDER — PRENATAL VITAMINS (DIS) PO TABS: 1.0000 | ORAL_TABLET | Freq: Every day | ORAL | Status: DC

## 2012-08-16 NOTE — ED Notes (Signed)
Pt. C/o right sided abdominal pain with n/v x3 days.  Pt. States nausea and vomiting increases with food.

## 2012-08-16 NOTE — ED Provider Notes (Signed)
History     CSN: [id number]  Arrival date & time 08/16/12  1039   First MD Initiated Contact with Patient 08/16/12 1123      Chief Complaint  Patient presents with  . Abdominal Pain  . Vomiting    (Consider location/radiation/quality/duration/timing/severity/associated sxs/prior treatment) HPI Pt reports a few days of RLQ abdominal pain, cramping aching, associated with nausea and occasional vomiting. No problems with bowels, no dysuria or hematuria. No vaginal bleeding or discharge. LMP in July  Past Medical History  Diagnosis Date  . Gestational diabetes   . Genital herpes   . Preterm labor     1st baby delivered at 35 wks  . Pregnancy induced hypertension     with 1st pregnancy    Past Surgical History  Procedure Date  . Wisdom tooth extraction 2007  . Tooth extraction     Family History  Problem Relation Age of Onset  . Diabetes Maternal Grandmother     History  Substance Use Topics  . Smoking status: Never Smoker   . Smokeless tobacco: Never Used  . Alcohol Use: No    OB History    Grav Para Term Preterm Abortions TAB SAB Ect Mult Living   2 2 0 2 0 0 0 0 0 2       Review of Systems All other systems reviewed and are negative except as noted in HPI.   Allergies  Review of patient's allergies indicates no known allergies.  Home Medications   Current Outpatient Rx  Name Route Sig Dispense Refill  . ACETAMINOPHEN 500 MG PO TABS Oral Take 500 mg by mouth every 6 (six) hours as needed. For pain     . FERROUS SULFATE 325 (65 FE) MG PO TABS Oral Take 1 tablet (325 mg total) by mouth 2 (two) times daily before a meal. 60 tablet 11  . METHYLDOPA 500 MG PO TABS Oral Take 1 tablet (500 mg total) by mouth every 8 (eight) hours. 90 tablet 1  . PRENATAL PLUS 27-1 MG PO TABS Oral Take 1 tablet by mouth daily.        BP 146/89  Pulse 80  Temp 98.2 F (36.8 C) (Oral)  Resp 16  Ht 5\' 3"  (1.6 m)  Wt 168 lb (76.204 kg)  BMI 29.76 kg/m2  SpO2 98%  LMP  06/11/2012  Physical Exam  Nursing note and vitals reviewed. Constitutional: She is oriented to person, place, and time. She appears well-developed and well-nourished.  HENT:  Head: Normocephalic and atraumatic.  Eyes: EOM are normal. Pupils are equal, round, and reactive to light.  Neck: Normal range of motion. Neck supple.  Cardiovascular: Normal rate, normal heart sounds and intact distal pulses.   Pulmonary/Chest: Effort normal and breath sounds normal.  Abdominal: Bowel sounds are normal. She exhibits no distension. There is no tenderness.  Genitourinary: Uterus is not tender. Cervix exhibits discharge. Cervix exhibits no motion tenderness. Right adnexum displays tenderness. Right adnexum displays no mass. Left adnexum displays no mass and no tenderness. Vaginal discharge found.  Musculoskeletal: Normal range of motion. She exhibits no edema and no tenderness.  Neurological: She is alert and oriented to person, place, and time. She has normal strength. No cranial nerve deficit or sensory deficit.  Skin: Skin is warm and dry. No rash noted.  Psychiatric: She has a normal mood and affect.    ED Course  Procedures (including critical care time)  Labs Reviewed  CBC WITH DIFFERENTIAL - Abnormal; Notable for the following:  Hemoglobin 11.8 (*)     HCT 35.4 (*)     RDW 15.6 (*)     Monocytes Relative 14 (*)     All other components within normal limits  COMPREHENSIVE METABOLIC PANEL - Abnormal; Notable for the following:    Glucose, Bld 102 (*)     BUN 5 (*)     Total Bilirubin 0.2 (*)     All other components within normal limits  URINALYSIS, ROUTINE W REFLEX MICROSCOPIC - Abnormal; Notable for the following:    Color, Urine AMBER (*)  BIOCHEMICALS MAY BE AFFECTED BY COLOR   APPearance TURBID (*)     Specific Gravity, Urine 1.040 (*)     Bilirubin Urine SMALL (*)     Ketones, ur 15 (*)     Protein, ur 30 (*)     Leukocytes, UA MODERATE (*)     All other components within  normal limits  PREGNANCY, URINE - Abnormal; Notable for the following:    Preg Test, Ur POSITIVE (*)     All other components within normal limits  HCG, QUANTITATIVE, PREGNANCY - Abnormal; Notable for the following:    hCG, Beta Chain, Sharene Butters, S [id number] (*)     All other components within normal limits  WET PREP, GENITAL - Abnormal; Notable for the following:    Clue Cells Wet Prep HPF POC TOO NUMEROUS TO COUNT (*)     WBC, Wet Prep HPF POC TOO NUMEROUS TO COUNT (*)     All other components within normal limits  URINE MICROSCOPIC-ADD ON - Abnormal; Notable for the following:    Squamous Epithelial / LPF MANY (*)     Bacteria, UA MANY (*)     All other components within normal limits  LIPASE, BLOOD  ABO/RH  GC/CHLAMYDIA PROBE AMP, GENITAL   US Ob Comp Less 14 Wks  08/16/2012  *RADIOLOGY REPORT*  Clinical Data: Abdominal pain.  Evaluate for potential ectopic pregnancy.  OBSTETRIC <14 WK ULTRASOUND  Technique:  Transabdominal ultrasound was performed for evaluation of the gestation as well as the maternal uterus and adnexal regions.  Comparison:  08/13/2011.  Intrauterine gestational sac: Single. Yolk sac: Present. Embryo: Present. Cardiac Activity: Present. Heart Rate: 163 beats per minute.  CRL: 33 mm           10   w  1   d        [place] EDC: 03/13/2013.  Subchorionic hemorrhage: None.  Maternal uterus/adnexae: Ovaries are normal in size and echotexture bilaterally with multiple small follicles. No abnormal adnexal masses.  No free fluid the cul-de-sac.  IMPRESSION: 1.  Single viable IUP with crown rump length of 33 mm corresponding to an estimated gestational age of [redacted] weeks 1 day, with fetal heart rate of 163 beats per minute. 2.  No findings suspicious for ectopic pregnancy.   Original Report Authenticated By: Florencia Reasons, M.D.      No diagnosis found.    MDM  Pregnancy test is positive. Sent for [place], quant and ABO/Rh. Pt is a G3P2A0, no prior history of GC/C or ectopic.   1:38  PM Pt with live IUP, no ectopic. Pt also has BV. No bleeding now, will not hold patient waiting for Rh status. She will followup with Ob in [place] when [place] returns there. Flagyl for BV.      Charles B. Bernette Mayers, MD 08/16/12 1339

## 2012-08-18 LAB — GC/CHLAMYDIA PROBE AMP, GENITAL: GC Probe Amp, Genital: NEGATIVE

## 2014-08-23 ENCOUNTER — Encounter (HOSPITAL_COMMUNITY): Payer: Self-pay | Admitting: Emergency Medicine

## 2014-08-23 ENCOUNTER — Emergency Department (HOSPITAL_COMMUNITY)
Admission: EM | Admit: 2014-08-23 | Discharge: 2014-08-23 | Disposition: A | Discharge status: Home / Self Care | Payer: BC Managed Care – PPO | Attending: Emergency Medicine | Admitting: Emergency Medicine

## 2014-08-23 DIAGNOSIS — N898 Other specified noninflammatory disorders of vagina: Secondary | ICD-10-CM | POA: Diagnosis present

## 2014-08-23 DIAGNOSIS — Z8619 Personal history of other infectious and parasitic diseases: Secondary | ICD-10-CM | POA: Diagnosis not present

## 2014-08-23 DIAGNOSIS — N6459 Other signs and symptoms in breast: Secondary | ICD-10-CM | POA: Insufficient documentation

## 2014-08-23 DIAGNOSIS — Z3202 Encounter for pregnancy test, result negative: Secondary | ICD-10-CM | POA: Diagnosis not present

## 2014-08-23 DIAGNOSIS — Z8632 Personal history of gestational diabetes: Secondary | ICD-10-CM | POA: Insufficient documentation

## 2014-08-23 DIAGNOSIS — N939 Abnormal uterine and vaginal bleeding, unspecified: Secondary | ICD-10-CM

## 2014-08-23 DIAGNOSIS — N390 Urinary tract infection, site not specified: Secondary | ICD-10-CM | POA: Insufficient documentation

## 2014-08-23 LAB — URINALYSIS, ROUTINE W REFLEX MICROSCOPIC
Bilirubin Urine: NEGATIVE
GLUCOSE, UA: NEGATIVE mg/dL
KETONES UR: 15 mg/dL — AB
Leukocytes, UA: NEGATIVE
Nitrite: NEGATIVE
PROTEIN: NEGATIVE mg/dL
Specific Gravity, Urine: 1.032 — ABNORMAL HIGH (ref 1.005–1.030)
Urobilinogen, UA: 0.2 mg/dL (ref 0.0–1.0)
pH: 5.5 (ref 5.0–8.0)

## 2014-08-23 LAB — BASIC METABOLIC PANEL
Anion gap: 13 (ref 5–15)
BUN: 6 mg/dL (ref 6–23)
CALCIUM: 9.2 mg/dL (ref 8.4–10.5)
CO2: 25 mEq/L (ref 19–32)
CREATININE: 0.85 mg/dL (ref 0.50–1.10)
Chloride: 102 mEq/L (ref 96–112)
Glucose, Bld: 97 mg/dL (ref 70–99)
Potassium: 4.1 mEq/L (ref 3.7–5.3)
Sodium: 140 mEq/L (ref 137–147)

## 2014-08-23 LAB — CBC WITH DIFFERENTIAL/PLATELET
BASOS ABS: 0 10*3/uL (ref 0.0–0.1)
Basophils Relative: 1 % (ref 0–1)
EOS ABS: 0.2 10*3/uL (ref 0.0–0.7)
Eosinophils Relative: 4 % (ref 0–5)
HEMATOCRIT: 30.9 % — AB (ref 36.0–46.0)
Hemoglobin: 9.2 g/dL — ABNORMAL LOW (ref 12.0–15.0)
LYMPHS ABS: 1.8 10*3/uL (ref 0.7–4.0)
LYMPHS PCT: 38 % (ref 12–46)
MCH: 21.4 pg — AB (ref 26.0–34.0)
MCHC: 29.8 g/dL — AB (ref 30.0–36.0)
MCV: 71.9 fL — ABNORMAL LOW (ref 78.0–100.0)
MONO ABS: 0.3 10*3/uL (ref 0.1–1.0)
Monocytes Relative: 6 % (ref 3–12)
NEUTROS ABS: 2.4 10*3/uL (ref 1.7–7.7)
Neutrophils Relative %: 51 % (ref 43–77)
Platelets: 334 10*3/uL (ref 150–400)
RBC: 4.3 MIL/uL (ref 3.87–5.11)
RDW: 16.9 % — AB (ref 11.5–15.5)
WBC: 4.7 10*3/uL (ref 4.0–10.5)

## 2014-08-23 LAB — WET PREP, GENITAL
Trich, Wet Prep: NONE SEEN
Yeast Wet Prep HPF POC: NONE SEEN

## 2014-08-23 LAB — URINE MICROSCOPIC-ADD ON

## 2014-08-23 LAB — POC URINE PREG, ED: Preg Test, Ur: NEGATIVE

## 2014-08-23 MED ORDER — NITROFURANTOIN MONOHYD MACRO 100 MG PO CAPS: 100.0000 mg | ORAL_CAPSULE | Freq: Two times a day (BID) | ORAL | Status: DC

## 2014-08-23 MED ORDER — MEDROXYPROGESTERONE ACETATE 5 MG PO TABS: 5.0000 mg | ORAL_TABLET | Freq: Every day | ORAL | Status: DC

## 2014-08-23 NOTE — ED Notes (Signed)
Has had period x 30 days now has clots in it no BCP she states now lightheaded and dizzy

## 2014-08-23 NOTE — Discharge Instructions (Signed)
Follow-up with the clinic provided.  Return here as needed. °

## 2014-08-23 NOTE — ED Provider Notes (Signed)
CSN: [id number]     Arrival date & time 08/23/14  1248 History   First MD Initiated Contact with Patient 08/23/14 1646     Chief Complaint  Patient presents with  . Vaginal Bleeding     (Consider location/radiation/quality/duration/timing/severity/associated sxs/prior Treatment) HPI Patient presents to the emergency department with vaginal bleeding, over the last one month.  Patient, states, that she's had consistent bleeding, over the last month.  She states that her period is normally 5-7 days.  Patient denies chest pain, shortness of breath, weakness, blurred vision, neck pain, back pain, dysuria, nausea, vomiting, diarrhea fever, or syncope.  The patient, states, that she did not take any regular medications.  Patient, states nothing seems to make her condition, better or worse.  Patient did not take any medications for her symptoms Past Medical History  Diagnosis Date  . Gestational diabetes   . Genital herpes   . Preterm labor     1st baby delivered at 35 wks  . Pregnancy induced hypertension     with 1st pregnancy   Past Surgical History  Procedure Laterality Date  . Wisdom tooth extraction  2007  . Tooth extraction     Family History  Problem Relation Age of Onset  . Diabetes Maternal Grandmother    History  Substance Use Topics  . Smoking status: Never Smoker   . Smokeless tobacco: Never Used  . Alcohol Use: No   OB History   Grav Para Term Preterm Abortions TAB SAB Ect Mult Living       Review of Systems  All other systems negative except as documented in the HPI. All pertinent positives and negatives as reviewed in the HPI.  Allergies  Review of patient's allergies indicates no known allergies.  Home Medications   Prior to Admission medications   Medication Sig Start Date End Date Taking? Authorizing Provider  acetaminophen (TYLENOL) 500 MG tablet Take 1,000 mg by mouth every 6 (six) hours as needed for moderate pain. For pain   Yes  Historical Provider, MD   BP 133/74  Pulse 79  Temp(Src) 98.1 F (36.7 C) (Oral)  Resp 16  SpO2 100% Physical Exam  Constitutional: She appears well-developed and well-nourished. No distress.  HENT:  Head: Normocephalic and atraumatic.  Mouth/Throat: Oropharynx is clear and moist.  Eyes: Pupils are equal, round, and reactive to light.  Neck: Normal range of motion. Neck supple.  Cardiovascular: Normal rate, regular rhythm and normal heart sounds.  Exam reveals no gallop and no friction rub.   No murmur heard. Pulmonary/Chest: Effort normal and breath sounds normal. No respiratory distress.  Abdominal: Soft. Bowel sounds are normal. She exhibits no distension. There is no tenderness.  Genitourinary: There is breast bleeding. Cervix exhibits no motion tenderness and no discharge.    ED Course  Procedures (including critical care time) Labs Review Labs Reviewed  WET PREP, GENITAL - Abnormal; Notable for the following:    Clue Cells Wet Prep HPF POC FEW (*)    WBC, Wet Prep HPF POC FEW (*)    All other components within normal limits  CBC WITH DIFFERENTIAL - Abnormal; Notable for the following:    Hemoglobin 9.2 (*)    HCT 30.9 (*)    MCV 71.9 (*)    MCH 21.4 (*)    MCHC 29.8 (*)    RDW 16.9 (*)    All other components within normal limits  URINALYSIS, ROUTINE W  REFLEX MICROSCOPIC - Abnormal; Notable for the following:    Specific Gravity, Urine 1.032 (*)    Hgb urine dipstick LARGE (*)    Ketones, ur 15 (*)    All other components within normal limits  URINE MICROSCOPIC-ADD ON - Abnormal; Notable for the following:    Bacteria, UA MANY (*)    All other components within normal limits  GC/CHLAMYDIA PROBE AMP  BASIC METABOLIC PANEL  POC URINE PREG, ED    Patient is referred to the GYN clinic at [facility], hospital.  We'll treat for UTI.  Told to return here as needed for any worsening in her condition.    Carlyle Dolly, PA-C 08/24/14 [phone]

## 2014-08-24 LAB — GC/CHLAMYDIA PROBE AMP
CT Probe RNA: NEGATIVE
GC Probe RNA: NEGATIVE

## 2014-08-25 NOTE — ED Provider Notes (Signed)
Medical screening examination/treatment/procedure(s) were performed by non-physician practitioner and as supervising physician I was immediately available for consultation/collaboration.   EKG Interpretation None       Leonce Bale M Falisha Osment, MD 08/25/14 1334 

## 2014-09-24 ENCOUNTER — Encounter: Payer: Self-pay | Admitting: Medical

## 2014-09-24 ENCOUNTER — Ambulatory Visit (INDEPENDENT_AMBULATORY_CARE_PROVIDER_SITE_OTHER): Payer: BC Managed Care – PPO | Admitting: Medical

## 2014-09-24 VITALS — BP 126/73 | HR 88 | Ht 63.0 in | Wt 204.9 lb

## 2014-09-24 DIAGNOSIS — N939 Abnormal uterine and vaginal bleeding, unspecified: Secondary | ICD-10-CM | POA: Insufficient documentation

## 2014-09-24 LAB — CBC
HEMATOCRIT: 28 % — AB (ref 36.0–46.0)
HEMOGLOBIN: 8.4 g/dL — AB (ref 12.0–15.0)
MCH: 19.8 pg — ABNORMAL LOW (ref 26.0–34.0)
MCHC: 30 g/dL (ref 30.0–36.0)
MCV: 66 fL — AB (ref 78.0–100.0)
Platelets: 402 10*3/uL — ABNORMAL HIGH (ref 150–400)
RBC: 4.24 MIL/uL (ref 3.87–5.11)
RDW: 17.9 % — AB (ref 11.5–15.5)
WBC: 5.3 10*3/uL (ref 4.0–10.5)

## 2014-09-24 LAB — HEMOGLOBIN A1C
Hgb A1c MFr Bld: 6.2 % — ABNORMAL HIGH (ref <5.7)
Mean Plasma Glucose: 131 mg/dL — ABNORMAL HIGH (ref <117)

## 2014-09-24 LAB — TSH: TSH: 0.895 u[IU]/mL (ref 0.350–4.500)

## 2014-09-24 MED ORDER — FERROUS SULFATE 325 (65 FE) MG PO TABS: 325.0000 mg | ORAL_TABLET | Freq: Every day | ORAL | Status: DC

## 2014-09-24 NOTE — Patient Instructions (Signed)
Contraception Choices Birth control (contraception) is the use of any methods or devices to stop pregnancy from happening. Below are some methods to help avoid pregnancy. HORMONAL BIRTH CONTROL  A small tube put under the skin of the upper arm (implant). The tube can stay in place for 3 years. The implant must be taken out after 3 years.  Shots given every 3 months.  Pills taken every day.  Patches that are changed once a week.  A ring put into the vagina (vaginal ring). The ring is left in place for 3 weeks and removed for 1 week. Then, a new ring is put in the vagina.  Emergency birth control pills taken after unprotected sex (intercourse). BARRIER BIRTH CONTROL   A thin covering worn on the penis (female condom) during sex.  A soft, loose covering put into the vagina (female condom) before sex.  A rubber bowl that sits over the cervix (diaphragm). The bowl must be made for you. The bowl is put into the vagina before sex. The bowl is left in place for 6 to 8 hours after sex.  A small, soft cup that fits over the cervix (cervical cap). The cup must be made for you. The cup can be left in place for 48 hours after sex.  A sponge that is put into the vagina before sex.  A chemical that kills or stops sperm from getting into the cervix and uterus (spermicide). The chemical may be a cream, jelly, foam, or pill. INTRAUTERINE (IUD) BIRTH CONTROL   IUD birth control is a small, T-shaped piece of plastic. The plastic is put inside the uterus. There are 2 types of IUD:  Copper IUD. The IUD is covered in copper wire. The copper makes a fluid that kills sperm. It can stay in place for 10 years.  Hormone IUD. The hormone stops pregnancy from happening. It can stay in place for 5 years. PERMANENT METHODS  When the woman has her fallopian tubes sealed, tied, or blocked during surgery. This stops the egg from traveling to the uterus.  The doctor places a small coil or insert into each fallopian  tube. This causes scar tissue to form and blocks the fallopian tubes.  When the female has the tubes that carry sperm tied off (vasectomy). NATURAL FAMILY PLANNING BIRTH CONTROL   Natural family planning means not having sex or using barrier birth control on the days the woman could become pregnant.  Use a calendar to keep track of the length of each period and know the days she can get pregnant.  Avoid sex during ovulation.  Use a thermometer to measure body temperature. Also watch for symptoms of ovulation.  Time sex to be after the woman has ovulated. Use condoms to help protect yourself against sexually transmitted infections (STIs). Do this no matter what type of birth control you use. Talk to your doctor about which type of birth control is best for you. Document Released: 09/09/2009 Document Revised: 11/17/2013 Document Reviewed: 06/03/2013 [facility] Patient Information 2015 [place]Care, [place]. This information is not intended to replace advice given to you by your health care provider. Make sure you discuss any questions you have with your health care provider. Dysfunctional Uterine Bleeding Normally, menstrual periods begin between ages 5411 to 7317 in young women. A normal menstrual cycle/period may begin every 23 days up to 35 days and lasts from 1 to 7 days. Around 12 to 14 days before your menstrual period starts, ovulation (ovary produces an egg) occurs.  When counting the time between menstrual periods, count from the first day of bleeding of the previous period to the first day of bleeding of the next period. Dysfunctional (abnormal) uterine bleeding is bleeding that is different from a normal menstrual period. Your periods may come earlier or later than usual. They may be lighter, have blood clots or be heavier. You may have bleeding between periods, or you may skip one period or more. You may have bleeding after sexual intercourse, bleeding after menopause, or no menstrual period. CAUSES     Pregnancy (normal, miscarriage, tubal).  IUDs (intrauterine device, birth control).  Birth control pills.  Hormone treatment.  Menopause.  Infection of the cervix.  Blood clotting problems.  Infection of the inside lining of the uterus.  Endometriosis, inside lining of the uterus growing in the pelvis and other female organs.  Adhesions (scar tissue) inside the uterus.  Obesity or severe weight loss.  Uterine polyps inside the uterus.  Cancer of the vagina, cervix, or uterus.  Ovarian cysts or polycystic ovary syndrome.  Medical problems (diabetes, thyroid disease).  Uterine fibroids (noncancerous tumor).  Problems with your female hormones.  Endometrial hyperplasia, very thick lining and enlarged cells inside of the uterus.  Medicines that interfere with ovulation.  Radiation to the pelvis or abdomen.  Chemotherapy. DIAGNOSIS   Your doctor will discuss the history of your menstrual periods, medicines you are taking, changes in your weight, stress in your life, and any medical problems you may have.  Your doctor will do a physical and pelvic examination.  Your doctor may want to perform certain tests to make a diagnosis, such as:  Pap test.  Blood tests.  Cultures for infection.  CT scan.  Ultrasound.  Hysteroscopy.  Laparoscopy.  MRI.  Hysterosalpingography.  D and C.  Endometrial biopsy. TREATMENT  Treatment will depend on the cause of the dysfunctional uterine bleeding (DUB). Treatment may include:  Observing your menstrual periods for a couple of months.  Prescribing medicines for medical problems, including:  Antibiotics.  Hormones.  Birth control pills.  Removing an IUD (intrauterine device, birth control).  Surgery:  D and C (scrape and remove tissue from inside the uterus).  Laparoscopy (examine inside the abdomen with a lighted tube).  Uterine ablation (destroy lining of the uterus with electrical current, laser,  heat, or freezing).  Hysteroscopy (examine cervix and uterus with a lighted tube).  Hysterectomy (remove the uterus). HOME CARE INSTRUCTIONS   If medicines were prescribed, take exactly as directed. Do not change or switch medicines without consulting your caregiver.  Long term heavy bleeding may result in iron deficiency. Your caregiver may have prescribed iron pills. They help replace the iron that your body lost from heavy bleeding. Take exactly as directed.  Do not take aspirin or medicines that contain aspirin one week before or during your menstrual period. Aspirin may make the bleeding worse.  If you need to change your sanitary pad or tampon more than once every 2 hours, stay in bed with your feet elevated and a cold pack on your lower abdomen. Rest as much as possible, until the bleeding stops or slows down.  Eat well-balanced meals. Eat foods high in iron. Examples are:  Leafy green vegetables.  Whole-grain breads and cereals.  Eggs.  Meat.  Liver.  Do not try to lose weight until the abnormal bleeding has stopped and your blood iron level is back to normal. Do not lift more than ten pounds or do strenuous activities  when you are bleeding.  For a couple of months, make note on your calendar, marking the start and ending of your period, and the type of bleeding (light, medium, heavy, spotting, clots or missed periods). This is for your caregiver to better evaluate your problem. SEEK MEDICAL CARE IF:   You develop nausea (feeling sick to your stomach) and vomiting, dizziness, or diarrhea while you are taking your medicine.  You are getting lightheaded or weak.  You have any problems that may be related to the medicine you are taking.  You develop pain with your DUB.  You want to remove your IUD.  You want to stop or change your birth control pills or hormones.  You have any type of abnormal bleeding mentioned above.  You are over 28 years old and have not had a  menstrual period yet.  You are 28 years old and you are still having menstrual periods.  You have any of the symptoms mentioned above.  You develop a rash. SEEK IMMEDIATE MEDICAL CARE IF:   An oral temperature above 102 F (38.9 C) develops.  You develop chills.  You are changing your sanitary pad or tampon more than once an hour.  You develop abdominal pain.  You pass out or faint. Document Released: 11/09/2000 Document Revised: 02/04/2012 Document Reviewed: 10/11/2009 [facility]ExitCare Patient Information 2015 [place] ChapelExitCare, MarylandLLC. This information is not intended to replace advice given to you by your health care provider. Make sure you discuss any questions you have with your health care provider.

## 2014-09-24 NOTE — Progress Notes (Signed)
Patient reports that her last period started on 8/28 and lasted until 10/23

## 2014-09-24 NOTE — Progress Notes (Signed)
Patient ID: [name], female   DOB: Apr 27, 1986, [age] y.o.   MRN: [id number] History:  [name]  is a 28 y.o. [id number] who presents to clinic today for irregular bleeding. [facility] and given provera x 5 days. She states that she took the medication and her bleeding was lighter, but did not stop until 09/17/14. Bleeding initially started on 07/18/14 and was heavy most days. She states that prior to the provera she was changing her pad hourly. She denies any vaginal bleeding today. She denies abdominal pain, history of irregular bleeding or recent birth control. She does endorse weakness and fatigue. [facility] and has not been on iron supplements.     The following portions of the patient's history were reviewed and updated as appropriate: allergies, current medications, past family history, past medical history, past social history, past surgical history and problem list.  Review of Systems:  Pertinent items are noted in HPI.  Objective:  Physical Exam BP 126/73  Pulse 88  Ht 5\' 3"  (1.6 m)  Wt 204 lb 14.4 oz (92.942 kg)  BMI 36.31 kg/m2  LMP 07/23/2014 GENERAL: Well-developed, well-nourished female in no acute distress.  HEENT: Normocephalic, atraumatic.  NECK: Supple. Normal thyroid.  LUNGS: Normal rate. Clear to auscultation bilaterally.  HEART: Regular rate and rhythm with no adventitious sounds.  ABDOMEN: Soft, nontender, nondistended. No organomegaly. EXTREMITIES: No cyanosis, clubbing, or edema   Assessment & Plan:  Assessment: Abnormal Uterine Bleeding  Plans: CBC, LH, FSH, HgbA1c, TSH today Pelvic US scheduled [facility] results and management Patient advised to call [facility] sooner if bleeding resumes and is heavy or lasts longer than 7 days  Whitney LowensteinJulie N Heavenleigh Petruzzi, PA-C 09/24/2014 11:01 AM

## 2014-09-25 LAB — LUTEINIZING HORMONE: LH: 13.4 m[IU]/mL

## 2014-09-25 LAB — FOLLICLE STIMULATING HORMONE: FSH: 5.7 m[IU]/mL

## 2014-09-27 ENCOUNTER — Encounter: Payer: Self-pay | Admitting: Medical

## 2014-09-27 LAB — TESTOSTERONE, FREE, TOTAL, SHBG
SEX HORMONE BINDING: 19 nmol/L (ref 18–114)
TESTOSTERONE FREE: 12.7 pg/mL — AB (ref 0.6–6.8)
TESTOSTERONE: 53 ng/dL (ref 10–70)
Testosterone-% Free: 2.4 % (ref 0.4–2.4)

## 2014-09-29 ENCOUNTER — Ambulatory Visit (HOSPITAL_COMMUNITY)
Admission: RE | Admit: 2014-09-29 | Discharge: 2014-09-29 | Disposition: A | Discharge status: Home / Self Care | Payer: BLUE CROSS/BLUE SHIELD | Source: Ambulatory Visit | Attending: Medical | Admitting: Medical

## 2014-09-29 DIAGNOSIS — N939 Abnormal uterine and vaginal bleeding, unspecified: Secondary | ICD-10-CM

## 2014-11-05 ENCOUNTER — Ambulatory Visit: Payer: BC Managed Care – PPO | Admitting: Medical

## 2014-12-13 ENCOUNTER — Encounter: Payer: Self-pay | Admitting: Nurse Practitioner

## 2014-12-13 ENCOUNTER — Ambulatory Visit (INDEPENDENT_AMBULATORY_CARE_PROVIDER_SITE_OTHER): Payer: BLUE CROSS/BLUE SHIELD | Admitting: Obstetrics & Gynecology

## 2014-12-13 VITALS — BP 135/86 | HR 85 | Temp 98.6°F | Ht 63.0 in | Wt 203.8 lb

## 2014-12-13 DIAGNOSIS — E282 Polycystic ovarian syndrome: Secondary | ICD-10-CM | POA: Diagnosis not present

## 2014-12-13 DIAGNOSIS — N939 Abnormal uterine and vaginal bleeding, unspecified: Secondary | ICD-10-CM

## 2014-12-13 NOTE — Patient Instructions (Signed)
Polycystic Ovarian Syndrome  Polycystic ovarian syndrome (PCOS) is a common hormonal disorder among women of reproductive age. Most women with PCOS grow many small cysts on their ovaries. PCOS can cause problems with your periods and make it difficult to get pregnant. It can also cause an increased risk of miscarriage with pregnancy. If left untreated, PCOS can lead to serious health problems, such as diabetes and heart disease.  CAUSES  The cause of PCOS is not fully understood, but genetics may be a factor.  SIGNS AND SYMPTOMS    Infrequent or no menstrual periods.    Inability to get pregnant (infertility) because of not ovulating.    Increased growth of hair on the face, chest, stomach, back, thumbs, thighs, or toes.    Acne, oily skin, or dandruff.    Pelvic pain.    Weight gain or obesity, usually carrying extra weight around the waist.    Type 2 diabetes.    High cholesterol.    High blood pressure.    Female-pattern baldness or thinning hair.    Patches of thickened and dark brown or black skin on the neck, arms, breasts, or thighs.    Tiny excess flaps of skin (skin tags) in the armpits or neck area.    Excessive snoring and having breathing stop at times while asleep (sleep apnea).    Deepening of the voice.    Gestational diabetes when pregnant.   DIAGNOSIS   There is no single test to diagnose PCOS.    Your health care provider will:    Take a medical history.    Perform a pelvic exam.    Have ultrasonography done.    Check your female and female hormone levels.    Measure glucose or sugar levels in the blood.    Do other blood tests.    If you are producing too many female hormones, your health care provider will make sure it is from PCOS. At the physical exam, your health care provider will want to evaluate the areas of increased hair growth. Try to allow natural hair growth for a few days before the visit.    During a pelvic exam, the ovaries may be  enlarged or swollen because of the increased number of small cysts. This can be seen more easily by using vaginal ultrasonography or screening to examine the ovaries and lining of the uterus (endometrium) for cysts. The uterine lining may become thicker if you have not been having a regular period.   TREATMENT   Because there is no cure for PCOS, it needs to be managed to prevent problems. Treatments are based on your symptoms. Treatment is also based on whether you want to have a baby or whether you need contraception.   Treatment may include:    Progesterone hormone to start a menstrual period.    Birth control pills to make you have regular menstrual periods.    Medicines to make you ovulate, if you want to get pregnant.    Medicines to control your insulin.    Medicine to control your blood pressure.    Medicine and diet to control your high cholesterol and triglycerides in your blood.   Medicine to reduce excessive hair growth.   Surgery, making small holes in the ovary, to decrease the amount of female hormone production. This is done through a long, lighted tube (laparoscope) placed into the pelvis through a tiny incision in the lower abdomen.   HOME CARE INSTRUCTIONS   Only   take over-the-counter or prescription medicine as directed by your health care provider.   Pay attention to the foods you eat and your activity levels. This can help reduce the effects of PCOS.   Keep your weight under control.   Eat foods that are low in carbohydrate and high in fiber.   Exercise regularly.  SEEK MEDICAL CARE IF:   Your symptoms do not get better with medicine.   You have new symptoms.  Document Released: 03/08/2005 Document Revised: 09/02/2013 Document Reviewed: 04/30/2013  ExitCare Patient Information 2015 Exit[employer], LLC. This information is not intended to replace advice given to you by your health care provider. Make sure you discuss any questions you have with your health care provider.

## 2014-12-13 NOTE — Progress Notes (Signed)
Last menstrual period 10/23/14-11/10/14

## 2014-12-13 NOTE — Progress Notes (Signed)
CLINIC ENCOUNTER NOTE  History:  29 y.o. G2P0202 here today for follow up after evaluation for AUB. No concerns.  The following portions of the patient's history were reviewed and updated as appropriate: allergies, current medications, past family history, past medical history, past social history, past surgical history and problem list.   Review of Systems:  Pertinent items are noted in HPI.  Objective:  BP 135/86 mmHg  Pulse 85  Temp(Src) 98.6 F (37 C)  Ht  (1.6 m)  Wt 203 lb 12.8 oz (92.443 kg)  BMI 36.11 kg/m2  LMP 10/23/2014 Physical Exam deferred  Labs and Imaging Results for orders placed or performed in visit on 09/24/14 (from the past 2016 hour(s))  TSH   Collection Time: 09/24/14 12:31 PM  Result Value Ref Range   TSH 0.895 0.350 - 4.500 uIU/mL  CBC   Collection Time: 09/24/14 12:31 PM  Result Value Ref Range   WBC 5.3 4.0 - 10.5 K/uL   RBC 4.24 3.87 - 5.11 MIL/uL   Hemoglobin 8.4 (L) 12.0 - 15.0 g/dL   HCT 16.1 (L) 09.6 - 04.5 %   MCV 66.0 (L) 78.0 - 100.0 fL   MCH 19.8 (L) 26.0 - 34.0 pg   MCHC 30.0 30.0 - 36.0 g/dL   RDW 40.9 (H) 81.1 - 91.4 %   Platelets 402 (H) 150 - 400 K/uL  HgB A1c   Collection Time: 09/24/14 12:31 PM  Result Value Ref Range   Hgb A1c MFr Bld 6.2 (H) <5.7 %   Mean Plasma Glucose 131 (H) <117 mg/dL  LH   Collection Time: 09/24/14 12:31 PM  Result Value Ref Range   LH 13.4 mIU/mL  [facility]   Collection Time: 09/24/14 12:31 PM  Result Value Ref Range   FSH 5.7 mIU/mL  Testosterone, free, total   Collection Time: 09/24/14 12:31 PM  Result Value Ref Range   Testosterone 53 10 - 70 ng/dL   Sex Hormone Binding 19 18 - 114 nmol/L   Testosterone, Free 12.7 (H) 0.6 - 6.8 pg/mL   Testosterone-% Free 2.4 0.4 - 2.4 %    09/29/2014   TRANSABDOMINAL AND TRANSVAGINAL ULTRASOUND OF PELVIS  CLINICAL DATA:  Abnormal uterine bleeding. Menorrhagia for 2 months.    TECHNIQUE: Both transabdominal and transvaginal ultrasound examinations of  the pelvis were performed. Transabdominal technique was performed for global imaging of the pelvis including uterus, ovaries, adnexal regions, and pelvic cul-de-sac. It was necessary to proceed with endovaginal exam following the transabdominal exam to visualize the endometrial stripe and ovaries.  COMPARISON:  None  FINDINGS: Uterus  Measurements: 11.2 x 4.5 x 5.3 cm. Retroverted. No fibroids or other mass visualized.  Endometrium  Thickness: 4 mm.  No focal abnormality visualized.  Right ovary  Measurements: 3.4 x 2.0 x 3.1 cm. Normal appearance/no adnexal mass.  Left ovary  Measurements: 3.3 x 1.8 x 2.7 cm. Normal appearance/no adnexal mass.  Other findings  No free fluid.  IMPRESSION: Retroverted uterus.  No evidence of fibroids.  Endometrial thickness measures 4 mm. If bleeding remains unresponsive to hormonal or medical therapy, sonohysterogram should be considered for focal lesion work-up. (Ref: Radiological Reasoning: Algorithmic Workup of Abnormal Vaginal Bleeding with Endovaginal Sonography and Sonohysterography. AJR 2008; 782:N56-21).  Normal appearance of both ovaries.  No adnexal mass identified.   Electronically Signed   By: Myles Rosenthal M.D.   On: 09/29/2014 16:33   Assessment & Plan:  With her AUB and elevated free testosterone, patient meets criteria  for PCOS.  Also has impaired glucose tolerance with HgA1C of 6.2.  Counseled patient about management of PCOS, recommended weight loss which helps with restoring ovulatory cycles, decreases glucose intolerance with improvement of metabolic risk, improves fertility/pregnancy rates and helps with overall health.  Even modest weight loss (5 to 10 percent reduction in body weight) in women with PCOS may result in these effects.  OCPs are also the mainstay of pharmacologic therapy for women with PCOS for managing hyperandrogenism and menstrual dysfunction and for providing contraception.  PCOS is also treated with Metformin given its association with  glucose intolerance and insulin resistance.  Over 50% of PCOS patients on 1500mg  of Metformin daily have been shown to ovulate successfully. Common side effects include GI intolerance, kidney and liver enzyme irregularities, lactic acidosis.    Patient is unsure of what she wants to do for now, will research her options and call back.  No plans for pregnancy anytime soon.  If she wants OCPs, can prescribe Sprintec.  If Metformin, she will be prescribed Metformin 500 mg po daily x 1 week, then bid x 1 week, then tid for one week. If she tolerates this, she can then be switched to 850 mg po bid or 1000 mg po bid.  Patient will return in 3 months for followup. Bleeding precautions reviewed.   Whitney CollinsUGONNA  Floella Ensz, MD, FACOG Attending Obstetrician & Gynecologist Center [facility], [facility]

## 2015-02-28 ENCOUNTER — Encounter (HOSPITAL_BASED_OUTPATIENT_CLINIC_OR_DEPARTMENT_OTHER): Payer: Self-pay

## 2015-02-28 ENCOUNTER — Emergency Department (HOSPITAL_BASED_OUTPATIENT_CLINIC_OR_DEPARTMENT_OTHER)
Admission: EM | Admit: 2015-02-28 | Discharge: 2015-03-01 | Discharge status: Against Medical Advice | Payer: BLUE CROSS/BLUE SHIELD | Attending: Emergency Medicine | Admitting: Emergency Medicine

## 2015-02-28 ENCOUNTER — Emergency Department (HOSPITAL_BASED_OUTPATIENT_CLINIC_OR_DEPARTMENT_OTHER): Payer: BLUE CROSS/BLUE SHIELD

## 2015-02-28 DIAGNOSIS — R05 Cough: Secondary | ICD-10-CM | POA: Diagnosis not present

## 2015-02-28 NOTE — ED Notes (Signed)
Called pt for revital pt not in the department or out side

## 2015-02-28 NOTE — ED Notes (Signed)
C/o chills, prod cough x 4 days

## 2015-03-15 ENCOUNTER — Emergency Department (HOSPITAL_BASED_OUTPATIENT_CLINIC_OR_DEPARTMENT_OTHER): Payer: BLUE CROSS/BLUE SHIELD

## 2015-03-15 ENCOUNTER — Emergency Department (HOSPITAL_BASED_OUTPATIENT_CLINIC_OR_DEPARTMENT_OTHER)
Admission: EM | Admit: 2015-03-15 | Discharge: 2015-03-15 | Disposition: A | Discharge status: Home / Self Care | Payer: BLUE CROSS/BLUE SHIELD | Attending: Emergency Medicine | Admitting: Emergency Medicine

## 2015-03-15 ENCOUNTER — Encounter (HOSPITAL_BASED_OUTPATIENT_CLINIC_OR_DEPARTMENT_OTHER): Payer: Self-pay | Admitting: *Deleted

## 2015-03-15 DIAGNOSIS — Z8619 Personal history of other infectious and parasitic diseases: Secondary | ICD-10-CM | POA: Diagnosis not present

## 2015-03-15 DIAGNOSIS — M940 Chondrocostal junction syndrome [Tietze]: Secondary | ICD-10-CM

## 2015-03-15 DIAGNOSIS — Z8751 Personal history of pre-term labor: Secondary | ICD-10-CM | POA: Insufficient documentation

## 2015-03-15 DIAGNOSIS — Z8632 Personal history of gestational diabetes: Secondary | ICD-10-CM | POA: Diagnosis not present

## 2015-03-15 DIAGNOSIS — Z3202 Encounter for pregnancy test, result negative: Secondary | ICD-10-CM | POA: Diagnosis not present

## 2015-03-15 DIAGNOSIS — R52 Pain, unspecified: Secondary | ICD-10-CM

## 2015-03-15 DIAGNOSIS — R079 Chest pain, unspecified: Secondary | ICD-10-CM | POA: Diagnosis present

## 2015-03-15 LAB — PREGNANCY, URINE: Preg Test, Ur: NEGATIVE

## 2015-03-15 MED ORDER — NAPROXEN 375 MG PO TABS: 375.0000 mg | ORAL_TABLET | Freq: Two times a day (BID) | ORAL | Status: DC

## 2015-03-15 MED ORDER — GI COCKTAIL ~~LOC~~
30.0000 mL | Freq: Once | ORAL | Status: AC
  Administered 2015-03-15: 30 mL via ORAL
  Filled 2015-03-15: qty 30

## 2015-03-15 MED ORDER — KETOROLAC TROMETHAMINE 60 MG/2ML IM SOLN
60.0000 mg | Freq: Once | INTRAMUSCULAR | Status: AC
Start: 2015-03-15 — End: 2015-03-15
  Administered 2015-03-15: 60 mg via INTRAMUSCULAR
  Filled 2015-03-15: qty 2

## 2015-03-15 NOTE — ED Provider Notes (Signed)
CSN: [id number]     Arrival date & time 03/15/15  0033 History   First MD Initiated Contact with Patient 03/15/15 0111     Chief Complaint  Patient presents with  . Chest Pain     (Consider location/radiation/quality/duration/timing/severity/associated sxs/prior Treatment) Patient is a 29 y.o. female presenting with chest pain. The history is provided by the patient.  Chest Pain Pain location:  Substernal area Pain quality: sharp   Pain radiates to:  Does not radiate Pain radiates to the back: no   Pain severity:  Moderate Onset quality:  Gradual Timing:  Constant Progression:  Unchanged Chronicity:  New Context: movement   Relieved by:  Nothing Worsened by:  Nothing tried Ineffective treatments:  None tried Associated symptoms: no abdominal pain, no cough, no fever, no palpitations, no shortness of breath, no syncope and not vomiting   Risk factors: no prior DVT/PE, no smoking and no surgery     Past Medical History  Diagnosis Date  . Gestational diabetes   . Genital herpes   . Preterm labor     1st baby delivered at 35 wks  . Pregnancy induced hypertension     with 1st pregnancy   Past Surgical History  Procedure Laterality Date  . Wisdom tooth extraction  2007  . Tooth extraction    . Cesarean section     Family History  Problem Relation Age of Onset  . Diabetes Maternal Grandmother    History  Substance Use Topics  . Smoking status: Never Smoker   . Smokeless tobacco: Never Used  . Alcohol Use: No   OB History    Gravida Para Term Preterm AB TAB SAB Ectopic Multiple Living   2 2 0 2 0 0 0 0 0 2      Review of Systems  Constitutional: Negative for fever.  Respiratory: Negative for cough, chest tightness and shortness of breath.   Cardiovascular: Positive for chest pain. Negative for palpitations, leg swelling and syncope.  Gastrointestinal: Negative for vomiting and abdominal pain.  All other systems reviewed and are negative.     Allergies   Review of patient's allergies indicates no known allergies.  Home Medications   Prior to Admission medications   Not on File   BP 154/97 mmHg  Pulse 86  Temp(Src) 98.9 F (37.2 C)  Resp 18  Ht 5\' 3"  (1.6 m)  Wt 200 lb (90.719 kg)  BMI 35.44 kg/m2  SpO2 98%  LMP 02/18/2015 Physical Exam  Constitutional: She is oriented to person, place, and time. She appears well-developed and well-nourished. No distress.  HENT:  Head: Normocephalic and atraumatic.  Mouth/Throat: Oropharynx is clear and moist.  Eyes: Conjunctivae are normal. Pupils are equal, round, and reactive to light.  Neck: Normal range of motion. Neck supple.  Cardiovascular: Normal rate and regular rhythm.   Pulmonary/Chest: Effort normal and breath sounds normal. No respiratory distress. She has no wheezes. She has no rales. She exhibits tenderness.  At costochondral junction, reproduces the pain  Abdominal: Soft. Bowel sounds are normal. There is no tenderness. There is no rebound and no guarding.  Musculoskeletal: Normal range of motion. She exhibits no edema or tenderness.  Neurological: She is alert and oriented to person, place, and time.  Skin: Skin is warm and dry.  Psychiatric: She has a normal mood and affect.    ED Course  Procedures (including critical care time) Labs Review Labs Reviewed  PREGNANCY, URINE    Imaging Review No results found.  EKG Interpretation   Date/Time:  Tuesday Etienne Millward 19 2016 00:41:33 EDT Ventricular Rate:  90 PR Interval:  142 QRS Duration: 72 QT Interval:  346 QTC Calculation: 423 R Axis:   34 Text Interpretation:  Normal sinus rhythm Confirmed by [facility]  MD,  Alencia Gordon ([id number]) on 03/15/2015 1:03:44 AM      MDM   Final diagnoses:  Pain    Perc negative wells 0.  Highly doubt ACS or cardiac etiology.  Symptoms consistent with MSK pain.  Will treat symptomatically.      Cy Blamer, MD 03/15/15 [phone]

## 2015-03-15 NOTE — ED Notes (Signed)
Pt c/o left side chest pain x 2 hrs

## 2015-05-23 ENCOUNTER — Other Ambulatory Visit: Payer: Self-pay

## 2016-08-03 ENCOUNTER — Encounter: Payer: Self-pay | Admitting: Pediatrics

## 2016-08-08 NOTE — Telephone Encounter (Signed)
errer  This encounter was created in error - please disregard.

## 2018-01-09 ENCOUNTER — Encounter (HOSPITAL_BASED_OUTPATIENT_CLINIC_OR_DEPARTMENT_OTHER): Payer: Self-pay

## 2018-01-09 ENCOUNTER — Emergency Department (HOSPITAL_BASED_OUTPATIENT_CLINIC_OR_DEPARTMENT_OTHER)
Admission: EM | Admit: 2018-01-09 | Discharge: 2018-01-10 | Disposition: A | Discharge status: Home / Self Care | Payer: BLUE CROSS/BLUE SHIELD | Attending: Emergency Medicine | Admitting: Emergency Medicine

## 2018-01-09 ENCOUNTER — Other Ambulatory Visit: Payer: Self-pay

## 2018-01-09 DIAGNOSIS — Y999 Unspecified external cause status: Secondary | ICD-10-CM | POA: Insufficient documentation

## 2018-01-09 DIAGNOSIS — S63502A Unspecified sprain of left wrist, initial encounter: Secondary | ICD-10-CM | POA: Insufficient documentation

## 2018-01-09 DIAGNOSIS — Y939 Activity, unspecified: Secondary | ICD-10-CM | POA: Insufficient documentation

## 2018-01-09 DIAGNOSIS — Y929 Unspecified place or not applicable: Secondary | ICD-10-CM | POA: Diagnosis not present

## 2018-01-09 DIAGNOSIS — S6992XA Unspecified injury of left wrist, hand and finger(s), initial encounter: Secondary | ICD-10-CM | POA: Diagnosis present

## 2018-01-09 HISTORY — DX: Polycystic ovarian syndrome: E28.2
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
## 2018-01-10 ENCOUNTER — Emergency Department (HOSPITAL_BASED_OUTPATIENT_CLINIC_OR_DEPARTMENT_OTHER): Payer: BLUE CROSS/BLUE SHIELD

## 2018-01-10 MED ORDER — ACETAMINOPHEN 325 MG PO TABS
650.0000 mg | ORAL_TABLET | Freq: Once | ORAL | Status: AC
  Administered 2018-01-10: 650 mg via ORAL
  Filled 2018-01-10: qty 2
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
## 2018-01-10 NOTE — Discharge Instructions (Signed)
You can take Tylenol or Ibuprofen as directed for pain. You can alternate Tylenol and Ibuprofen every 4 hours. If you take Tylenol at 1pm, then you can take Ibuprofen at 5pm. Then you can take Tylenol again at 9pm.   Follow the RICE (Rest, Ice, Compression, Elevation) protocol as directed.   As we discussed, because of the location of your pain, you could have a missed fracture that cannot be seen on x-ray.  He will need to follow-up with referred orthopedic doctor for further evaluation.  Call their office and her appointment.  Wear the splint for support and stabilization.  Return to the emergency department for any worsening pain, redness or swelling of the wrist, numbness/weakness of the wrist or any other worsening or concerning symptoms.

## 2019-01-01 ENCOUNTER — Encounter (HOSPITAL_BASED_OUTPATIENT_CLINIC_OR_DEPARTMENT_OTHER): Payer: Self-pay | Admitting: *Deleted

## 2019-01-01 ENCOUNTER — Emergency Department (HOSPITAL_BASED_OUTPATIENT_CLINIC_OR_DEPARTMENT_OTHER)
Admission: EM | Admit: 2019-01-01 | Discharge: 2019-01-01 | Disposition: A | Discharge status: Home / Self Care | Payer: BLUE CROSS/BLUE SHIELD | Attending: Emergency Medicine | Admitting: Emergency Medicine

## 2019-01-01 ENCOUNTER — Other Ambulatory Visit: Payer: Self-pay

## 2019-01-01 ENCOUNTER — Emergency Department (HOSPITAL_BASED_OUTPATIENT_CLINIC_OR_DEPARTMENT_OTHER): Payer: BLUE CROSS/BLUE SHIELD

## 2019-01-01 DIAGNOSIS — J111 Influenza due to unidentified influenza virus with other respiratory manifestations: Secondary | ICD-10-CM | POA: Diagnosis not present

## 2019-01-01 DIAGNOSIS — R69 Illness, unspecified: Secondary | ICD-10-CM

## 2019-01-01 DIAGNOSIS — R05 Cough: Secondary | ICD-10-CM | POA: Diagnosis present

## 2019-01-01 MED ORDER — OSELTAMIVIR PHOSPHATE 75 MG PO CAPS: 75.0000 mg | ORAL_CAPSULE | Freq: Two times a day (BID) | ORAL | 0 refills | Status: DC

## 2019-01-01 MED ORDER — BENZONATATE 100 MG PO CAPS: 100.0000 mg | ORAL_CAPSULE | Freq: Three times a day (TID) | ORAL | 0 refills | Status: DC

## 2019-01-01 NOTE — Discharge Instructions (Signed)
Please read and follow all provided instructions.  Your diagnoses today include:  1. Influenza-like illness     Tests performed today include:  Chest x-ray -no pneumonia  Vital signs. See below for your results today.   Medications prescribed:   Tamiflu - medication for influenza  This medication, when taken within the first 48 hours of illness, may help decrease the severity of the flu and cause symptoms to improve approximately 12 hours sooner. About 1 out of 5 patients may have diarrhea and vomiting from this medication.    Tessalon Perles - cough suppressant medication  Take any prescribed medications only as directed.  Home care instructions:  Follow any educational materials contained in this packet. Please continue drinking plenty of fluids. Use over-the-counter cold and flu medications as needed as directed on packaging for symptom relief. You may also use ibuprofen or tylenol as directed on packaging for pain or fever.   BE VERY CAREFUL not to take multiple medicines containing Tylenol (also called acetaminophen). Doing so can lead to an overdose which can damage your liver and cause liver failure and possibly death.   Follow-up instructions: Please follow-up with your primary care provider in the next 3 days for further evaluation of your symptoms.   Return instructions:   Please return to the Emergency Department if you experience worsening symptoms.  Please return if you have a high fever greater than 101 degrees not controlled with over-the-counter medications, persistent vomiting and cannot keep down fluids, or worsening trouble breathing.  Please return if you have any other emergent concerns.  Additional Information:  Your vital signs today were: BP (!) 137/94 (BP Location: Right Arm)    Pulse (!) 114    Temp 98.3 F (36.8 C) (Oral)    Resp 20    Ht 5\' 3"  (1.6 m)    Wt 98.9 kg    LMP 12/25/2018    SpO2 98%    BMI 38.62 kg/m  If your blood pressure (BP) was  elevated above 135/85 this visit, please have this repeated by your doctor within one month.

## 2019-01-01 NOTE — ED Notes (Signed)
ED Provider at bedside. 

## 2019-01-01 NOTE — ED Provider Notes (Signed)
[facility] EMERGENCY DEPARTMENT Provider Note   CSN: [id number] Arrival date & time: 01/01/19  1526     History   Chief Complaint Chief Complaint  Patient presents with  . Cough    HPI [name] is a 33 y.o. female.  Patient presents the emergency department with complaint of fever to 102 F at home, nasal congestion, cough, sore throat, body aches worsening over the past 2 days.  Patient states that 1 to 2 weeks ago she was diagnosed presumptively with influenza and was treated with Tamiflu.  Her symptoms did improve however have worsened again.  She reports some shortness of breath.  One episode of vomiting yesterday and some associated nonbloody diarrhea.  She has been taking Mucinex with some relief.  No known sick contacts.  Course is constant.  States decreased urination but no dysuria or hematuria, increased frequency urgency.  No lower extremity swelling or pain.     Past Medical History:  Diagnosis Date  . Genital herpes   . Gestational diabetes   . PCOS (polycystic ovarian syndrome)   . Pregnancy induced hypertension    with 1st pregnancy  . Preterm labor    1st baby delivered at 35 wks    Patient Active Problem List   Diagnosis Date Noted  . PCOS (polycystic ovarian syndrome) 12/13/2014  . Abnormal uterine bleeding (AUB) 09/24/2014  . Normal delivery 08/16/2011  . Genital herpes     Past Surgical History:  Procedure Laterality Date  . CESAREAN SECTION    . TOOTH EXTRACTION    . WISDOM TOOTH EXTRACTION  2007     OB History    Gravida  2   Para  2   Term  0   Preterm  2   AB  0   Living  2     SAB  0   TAB  0   Ectopic  0   Multiple  0   Live Births  1            Home Medications    Prior to Admission medications   Not on File    Family History Family History  Problem Relation Age of Onset  . Diabetes Maternal Grandmother     Social History Social History   Tobacco Use  . Smoking status: Never  Smoker  . Smokeless tobacco: Never Used  Substance Use Topics  . Alcohol use: No  . Drug use: No     Allergies   Patient has no known allergies.   Review of Systems Review of Systems  Constitutional: Positive for chills, fatigue and fever.  HENT: Positive for congestion, rhinorrhea and sore throat. Negative for ear pain and sinus pressure.   Eyes: Negative for redness.  Respiratory: Positive for cough. Negative for wheezing.   Gastrointestinal: Positive for diarrhea, nausea and vomiting. Negative for abdominal pain.  Genitourinary: Positive for decreased urine volume. Negative for dysuria.  Musculoskeletal: Positive for myalgias. Negative for neck stiffness.  Skin: Negative for rash.  Neurological: Negative for headaches.  Hematological: Negative for adenopathy.     Physical Exam Updated Vital Signs BP (!) 137/94 (BP Location: Right Arm)   Pulse (!) 114   Temp 98.3 F (36.8 C) (Oral)   Resp 20   Ht 5\' 3"  (1.6 m)   Wt 98.9 kg   LMP 12/25/2018   SpO2 98%   BMI 38.62 kg/m   Physical Exam Vitals signs and nursing note reviewed.  Constitutional:  Appearance: She is well-developed.  HENT:     Head: Normocephalic and atraumatic.     Jaw: No trismus.     Right Ear: Tympanic membrane, ear canal and external ear normal.     Left Ear: Tympanic membrane, ear canal and external ear normal.     Nose: Nose normal. No mucosal edema or rhinorrhea.     Mouth/Throat:     Mouth: Mucous membranes are moist. Mucous membranes are not dry. No oral lesions.     Pharynx: Uvula midline. No oropharyngeal exudate, posterior oropharyngeal erythema or uvula swelling.     Tonsils: No tonsillar abscesses.  Eyes:     General:        Right eye: No discharge.        Left eye: No discharge.     Conjunctiva/sclera: Conjunctivae normal.  Neck:     Musculoskeletal: Normal range of motion and neck supple.  Cardiovascular:     Rate and Rhythm: Regular rhythm. Tachycardia present.     Heart  sounds: Normal heart sounds.  Pulmonary:     Effort: Pulmonary effort is normal. No respiratory distress.     Breath sounds: Normal breath sounds. No stridor. No wheezing, rhonchi or rales.     Comments: Coughing during exam, no distress Abdominal:     Palpations: Abdomen is soft.     Tenderness: There is no abdominal tenderness.  Lymphadenopathy:     Cervical: No cervical adenopathy.  Skin:    General: Skin is warm and dry.  Neurological:     Mental Status: She is alert.      ED Treatments / Results  Labs (all labs ordered are listed, but only abnormal results are displayed) Labs Reviewed - No data to display  EKG None  Radiology No results found.  Procedures Procedures (including critical care time)  Medications Ordered in ED Medications - No data to display   Initial Impression / Assessment and Plan / ED Course  I have reviewed the triage vital signs and the nursing notes.  Pertinent labs & imaging results that were available during my care of the patient were reviewed by me and considered in my medical decision making (see chart for details).     Patient seen and examined. Work-up initiated.   Vital signs reviewed and are as follows: BP (!) 137/94 (BP Location: Right Arm)   Pulse (!) 114   Temp 98.3 F (36.8 C) (Oral)   Resp 20   Ht 5\' 3"  (1.6 m)   Wt 98.9 kg   LMP 12/25/2018   SpO2 98%   BMI 38.62 kg/m   Overall patient appears well in no respiratory distress.  Frequent coughing during exam however speaks in full sentences.  She does not appear toxic.  Given her recent viral illness, x-ray ordered to evaluate for secondary pneumonia.  If this is positive, will start on antibiotics.  If negative, will discuss symptom control and potential retreatment with Tamiflu.  She needs to rest and hydrate well at home.   4:20 PM patient updated on x-ray results.  We discussed retreatment with Tamiflu given her new symptoms.  We will also give prescription for  Tessalon.  Patient discharged to home. Encouraged to rest and drink plenty of fluids.  Patient told to return to ED or see their primary doctor if their symptoms worsen, high fever not controlled with tylenol, persistent vomiting, they feel they are dehydrated, or if they have any other concerns.  Patient verbalized understanding and agreed  with plan.     Final Clinical Impressions(s) / ED Diagnoses   Final diagnoses:  Influenza-like illness   Patient with symptoms consistent with influenza. Vitals are stable, low-grade fever. No signs of dehydration, tolerating PO's. Lungs are clear.  Chest x-ray without signs of secondary pneumonia.  Supportive therapy indicated with return if symptoms worsen. Patient counseled.   ED Discharge Orders         Ordered    oseltamivir (TAMIFLU) 75 MG capsule  Every 12 hours,   Status:  Discontinued     01/01/19 1613    benzonatate (TESSALON) 100 MG capsule  Every 8 hours,   Status:  Discontinued     01/01/19 1613    benzonatate (TESSALON) 100 MG capsule  Every 8 hours     01/01/19 1615    oseltamivir (TAMIFLU) 75 MG capsule  Every 12 hours     01/01/19 1615           Renne CriglerGeiple, Natina Wiginton, PA-C 01/01/19 1620    Vanetta MuldersZackowski, Scott, MD 01/02/19 [phone]

## 2019-01-01 NOTE — ED Triage Notes (Signed)
Cough, cold symptoms.

## 2021-08-08 ENCOUNTER — Emergency Department (HOSPITAL_BASED_OUTPATIENT_CLINIC_OR_DEPARTMENT_OTHER)
Admission: EM | Admit: 2021-08-08 | Discharge: 2021-08-08 | Disposition: A | Discharge status: Home / Self Care | Payer: BC Managed Care – PPO | Attending: Emergency Medicine | Admitting: Emergency Medicine

## 2021-08-08 ENCOUNTER — Encounter (HOSPITAL_BASED_OUTPATIENT_CLINIC_OR_DEPARTMENT_OTHER): Payer: Self-pay | Admitting: Emergency Medicine

## 2021-08-08 ENCOUNTER — Other Ambulatory Visit: Payer: Self-pay

## 2021-08-08 ENCOUNTER — Emergency Department (HOSPITAL_BASED_OUTPATIENT_CLINIC_OR_DEPARTMENT_OTHER): Payer: BC Managed Care – PPO

## 2021-08-08 DIAGNOSIS — U071 COVID-19: Secondary | ICD-10-CM | POA: Insufficient documentation

## 2021-08-08 DIAGNOSIS — R1011 Right upper quadrant pain: Secondary | ICD-10-CM | POA: Diagnosis not present

## 2021-08-08 DIAGNOSIS — R0602 Shortness of breath: Secondary | ICD-10-CM | POA: Insufficient documentation

## 2021-08-08 DIAGNOSIS — D509 Iron deficiency anemia, unspecified: Secondary | ICD-10-CM | POA: Diagnosis not present

## 2021-08-08 LAB — CBC WITH DIFFERENTIAL/PLATELET
Abs Immature Granulocytes: 0.02 10*3/uL (ref 0.00–0.07)
Basophils Absolute: 0 10*3/uL (ref 0.0–0.1)
Basophils Relative: 0 %
Eosinophils Absolute: 0 10*3/uL (ref 0.0–0.5)
Eosinophils Relative: 1 %
HCT: 35.1 % — ABNORMAL LOW (ref 36.0–46.0)
Hemoglobin: 10.4 g/dL — ABNORMAL LOW (ref 12.0–15.0)
Immature Granulocytes: 0 %
Lymphocytes Relative: 35 %
Lymphs Abs: 2.1 10*3/uL (ref 0.7–4.0)
MCH: 20.1 pg — ABNORMAL LOW (ref 26.0–34.0)
MCHC: 29.6 g/dL — ABNORMAL LOW (ref 30.0–36.0)
MCV: 67.9 fL — ABNORMAL LOW (ref 80.0–100.0)
Monocytes Absolute: 0.8 10*3/uL (ref 0.1–1.0)
Monocytes Relative: 14 %
Neutro Abs: 2.9 10*3/uL (ref 1.7–7.7)
Neutrophils Relative %: 50 %
Platelets: 311 10*3/uL (ref 150–400)
RBC: 5.17 MIL/uL — ABNORMAL HIGH (ref 3.87–5.11)
RDW: 19.5 % — ABNORMAL HIGH (ref 11.5–15.5)
WBC: 5.9 10*3/uL (ref 4.0–10.5)
nRBC: 0 % (ref 0.0–0.2)

## 2021-08-08 LAB — COMPREHENSIVE METABOLIC PANEL
ALT: 15 U/L (ref 0–44)
AST: 18 U/L (ref 15–41)
Albumin: 4 g/dL (ref 3.5–5.0)
Alkaline Phosphatase: 60 U/L (ref 38–126)
Anion gap: 11 (ref 5–15)
BUN: 8 mg/dL (ref 6–20)
CO2: 26 mmol/L (ref 22–32)
Calcium: 9 mg/dL (ref 8.9–10.3)
Chloride: 98 mmol/L (ref 98–111)
Creatinine, Ser: 0.83 mg/dL (ref 0.44–1.00)
GFR, Estimated: >60 mL/min (ref >60)
Glucose, Bld: 109 mg/dL — ABNORMAL HIGH (ref 70–99)
Potassium: 3.7 mmol/L (ref 3.5–5.1)
Sodium: 135 mmol/L (ref 135–145)
Total Bilirubin: 0.5 mg/dL (ref 0.3–1.2)
Total Protein: 8.6 g/dL — ABNORMAL HIGH (ref 6.5–8.1)

## 2021-08-08 LAB — LIPASE, BLOOD: Lipase: 23 U/L (ref 11–51)

## 2021-08-08 MED ORDER — ONDANSETRON HCL 4 MG/2ML IJ SOLN
4.0000 mg | Freq: Once | INTRAMUSCULAR | Status: AC
  Administered 2021-08-08: 4 mg via INTRAVENOUS
  Filled 2021-08-08: qty 2

## 2021-08-08 MED ORDER — IOHEXOL 350 MG/ML SOLN
100.0000 mL | Freq: Once | INTRAVENOUS | Status: AC | PRN
  Administered 2021-08-08: 100 mL via INTRAVENOUS

## 2021-08-08 MED ORDER — MORPHINE SULFATE (PF) 4 MG/ML IV SOLN
4.0000 mg | Freq: Once | INTRAVENOUS | Status: AC
  Administered 2021-08-08: 4 mg via INTRAVENOUS
  Filled 2021-08-08: qty 1

## 2021-08-08 NOTE — ED Provider Notes (Signed)
[facility] EMERGENCY DEPARTMENT Provider Note   CSN: [id number] Arrival date & time: 08/08/21  0008     History Chief Complaint  Patient presents with   Shortness of Breath    [name] is a 35 y.o. female.  The history is provided by the patient.  Shortness of Breath She has history of polycystic ovarian syndrome and comes in because of pain in the right upper abdomen/lower chest.  She started getting sick with cough 3 days ago and was diagnosed with COVID-19.  She had not been running any fevers or had any chills or sweats.  She developed pain in the right lower chest/upper abdomen at about 6 PM.  Pain is sharp and worse with a deep breath.  She rates pain at 8/10.  There is no radiation of pain to the back.  She had been having diarrhea, but that had resolved at about noon yesterday.  She also initially had some nausea which has improved.  She is not using any form of contraception, last menses was 9/2 and normal.   Past Medical History:  Diagnosis Date   Genital herpes    Gestational diabetes    PCOS (polycystic ovarian syndrome)    Pregnancy induced hypertension    with 1st pregnancy   Preterm labor    1st baby delivered at 35 wks    Patient Active Problem List   Diagnosis Date Noted   PCOS (polycystic ovarian syndrome) 12/13/2014   Abnormal uterine bleeding (AUB) 09/24/2014   Normal delivery 08/16/2011   Genital herpes     Past Surgical History:  Procedure Laterality Date   CESAREAN SECTION     TOOTH EXTRACTION     WISDOM TOOTH EXTRACTION  2007     OB History     Gravida  2   Para  2   Term  0   Preterm  2   AB  0   Living  2      SAB  0   IAB  0   Ectopic  0   Multiple  0   Live Births  1           Family History  Problem Relation Age of Onset   Diabetes Maternal Grandmother     Social History   Tobacco Use   Smoking status: Never   Smokeless tobacco: Never  Vaping Use   Vaping Use: Never used  Substance  Use Topics   Alcohol use: Yes    Comment: occ   Drug use: No    Home Medications Prior to Admission medications   Medication Sig Start Date End Date Taking? Authorizing Provider  benzonatate (TESSALON) 100 MG capsule Take 1 capsule (100 mg total) by mouth every 8 (eight) hours. 01/01/19   Renne Crigler, PA-C  oseltamivir (TAMIFLU) 75 MG capsule Take 1 capsule (75 mg total) by mouth every 12 (twelve) hours. 01/01/19   Renne Crigler, PA-C    Allergies    Patient has no known allergies.  Review of Systems   Review of Systems  Respiratory:  Positive for shortness of breath.   All other systems reviewed and are negative.  Physical Exam Updated Vital Signs BP (!) 130/97 (BP Location: Left Arm)   Pulse (!) 114   Temp 98.9 F (37.2 C) (Oral)   Resp 20   Ht 5\' 3"  (1.6 m)   Wt 95.3 kg   LMP 07/28/2021 (Exact Date)   SpO2 97%   BMI 37.20 kg/m  Physical Exam Vitals and nursing note reviewed.  35 year old female, resting comfortably and in no acute distress. Vital signs are significant for borderline elevated blood pressure, and elevated heart rate. Oxygen saturation is 97%, which is normal. Head is normocephalic and atraumatic. PERRLA, EOMI. Oropharynx is clear. Neck is nontender and supple without adenopathy or JVD. Back is nontender and there is no CVA tenderness. Lungs are clear without rales, wheezes, or rhonchi. Chest is nontender. Heart has regular rate and rhythm without murmur. Abdomen is soft, flat, with mild right upper quadrant tenderness.  There is no rebound or guarding.  There are no masses or hepatosplenomegaly and peristalsis is normoactive. Extremities have no cyanosis or edema, full range of motion is present. Skin is warm and dry without rash. Neurologic: Mental status is normal, cranial nerves are intact, there are no motor or sensory deficits.  ED Results / Procedures / Treatments   Labs (all labs ordered are listed, but only abnormal results are  displayed) Labs Reviewed  CBC WITH DIFFERENTIAL/PLATELET  COMPREHENSIVE METABOLIC PANEL  LIPASE, BLOOD  PREGNANCY, URINE  URINALYSIS, ROUTINE W REFLEX MICROSCOPIC    EKG EKG Interpretation  Date/Time:  Tuesday August 08 2021 00:26:15 EDT Ventricular Rate:  116 PR Interval:  142 QRS Duration: 72 QT Interval:  314 QTC Calculation: 436 R Axis:   20 Text Interpretation: Sinus tachycardia Cannot rule out Anterior infarct , age undetermined T wave abnormality, consider inferior ischemia Abnormal ECG When compared with ECG of 03/12/2015, T wave abnormality is slightly more prominent Confirmed by Dione Booze ([id number]) on 08/08/2021 12:34:08 AM  Radiology DG Chest 2 View  Result Date: 08/08/2021 CLINICAL DATA:  Cough, right chest pain EXAM: CHEST - 2 VIEW COMPARISON:  01/01/2019 FINDINGS: Low lung volumes. Lungs are clear. No pleural effusion or pneumothorax. The heart is normal in size. Visualized osseous structures are within normal limits. IMPRESSION: Normal chest radiographs. Electronically Signed   By: Charline Bills M.D.   On: 08/08/2021 00:49   CT Angio Chest PE W and/or Wo Contrast  Result Date: 08/08/2021 CLINICAL DATA:  Right-sided rib pain and shortness of breath. Diverticulitis suspected. EXAM: CT ANGIOGRAPHY CHEST CT ABDOMEN AND PELVIS WITH CONTRAST TECHNIQUE: Multidetector CT imaging of the chest was performed using the standard protocol during bolus administration of intravenous contrast. Multiplanar CT image reconstructions and MIPs were obtained to evaluate the vascular anatomy. Multidetector CT imaging of the abdomen and pelvis was performed using the standard protocol during bolus administration of intravenous contrast. CONTRAST:  OMNIPAQUE IOHEXOL 350 MG/ML SOLN COMPARISON:  None. FINDINGS: CTA CHEST FINDINGS Cardiovascular: The heart size is normal. No substantial pericardial effusion. No thoracic aortic aneurysm. There is no filling defect within the opacified  pulmonary arteries to suggest the presence of an acute pulmonary embolus. Mediastinum/Nodes: No mediastinal lymphadenopathy. There is no hilar lymphadenopathy. The esophagus has normal imaging features. There is no axillary lymphadenopathy. Lungs/Pleura: Subsegmental atelectasis noted in the lower lungs bilaterally. No focal airspace consolidation. No pleural effusion. Musculoskeletal: No worrisome lytic or sclerotic osseous abnormality. Review of the MIP images confirms the above findings. CT ABDOMEN and PELVIS FINDINGS Hepatobiliary: No suspicious focal abnormality within the liver parenchyma. There is no evidence for gallstones, gallbladder wall thickening, or pericholecystic fluid. No intrahepatic or extrahepatic biliary dilation. Pancreas: No focal mass lesion. No dilatation of the main duct. No intraparenchymal cyst. No peripancreatic edema. Spleen: No splenomegaly. No focal mass lesion. Adrenals/Urinary Tract: No adrenal nodule or mass. Kidneys unremarkable. No evidence for hydroureter.  The urinary bladder appears normal for the degree of distention. Stomach/Bowel: Stomach is unremarkable. No gastric wall thickening. No evidence of outlet obstruction. Duodenum is normally positioned as is the ligament of Treitz. No small bowel wall thickening. No small bowel dilatation. The terminal ileum is normal. The appendix is normal. No gross colonic mass. No colonic wall thickening. Vascular/Lymphatic: No abdominal aortic aneurysm. No abdominal aortic atherosclerotic calcification. There is no gastrohepatic or hepatoduodenal ligament lymphadenopathy. No retroperitoneal or mesenteric lymphadenopathy. No pelvic sidewall lymphadenopathy. Reproductive: The uterus is unremarkable.  There is no adnexal mass. Other: No intraperitoneal free fluid. Subtle edema/inflammation is identified in the right upper quadrant of the abdomen, along the inferior tip of the right liver extending up towards the gallbladder fossa and involving  the omentum of the hepatic flexure. The etiology of this finding is not readily apparent as there is no evidence for gallstones or gallbladder wall thickening. The right colon shows no diverticular disease or wall thickening. The appearance of the edema/inflammation is not entirely typical for epiploic appendagitis. Musculoskeletal: No worrisome lytic or sclerotic osseous abnormality. Review of the MIP images confirms the above findings. IMPRESSION: 1. No CT evidence for acute pulmonary embolus. 2. Subtle edema/inflammation in the right upper quadrant of the abdomen, along the inferior tip of the right liver extending up towards the gallbladder fossa and involving the omentum of the hepatic flexure. Source of this finding is not evident. Features are not entirely characteristic for epiploic appendagitis although this would be a consideration. Gallbladder source for the edema/inflammation is also possible although no gallstones or gallbladder wall thickening evident and there is no frank pericholecystic fluid. No colonic wall thickening to suggest colitis as an etiology. Findings are more subtle and diffuse than typically seen in the setting of omental infarct. Electronically Signed   By: Kennith Center M.D.   On: 08/08/2021 06:41   CT ABDOMEN PELVIS W CONTRAST  Result Date: 08/08/2021 CLINICAL DATA:  Right-sided rib pain and shortness of breath. Diverticulitis suspected. EXAM: CT ANGIOGRAPHY CHEST CT ABDOMEN AND PELVIS WITH CONTRAST TECHNIQUE: Multidetector CT imaging of the chest was performed using the standard protocol during bolus administration of intravenous contrast. Multiplanar CT image reconstructions and MIPs were obtained to evaluate the vascular anatomy. Multidetector CT imaging of the abdomen and pelvis was performed using the standard protocol during bolus administration of intravenous contrast. CONTRAST:  OMNIPAQUE IOHEXOL 350 MG/ML SOLN COMPARISON:  None. FINDINGS: CTA CHEST FINDINGS  Cardiovascular: The heart size is normal. No substantial pericardial effusion. No thoracic aortic aneurysm. There is no filling defect within the opacified pulmonary arteries to suggest the presence of an acute pulmonary embolus. Mediastinum/Nodes: No mediastinal lymphadenopathy. There is no hilar lymphadenopathy. The esophagus has normal imaging features. There is no axillary lymphadenopathy. Lungs/Pleura: Subsegmental atelectasis noted in the lower lungs bilaterally. No focal airspace consolidation. No pleural effusion. Musculoskeletal: No worrisome lytic or sclerotic osseous abnormality. Review of the MIP images confirms the above findings. CT ABDOMEN and PELVIS FINDINGS Hepatobiliary: No suspicious focal abnormality within the liver parenchyma. There is no evidence for gallstones, gallbladder wall thickening, or pericholecystic fluid. No intrahepatic or extrahepatic biliary dilation. Pancreas: No focal mass lesion. No dilatation of the main duct. No intraparenchymal cyst. No peripancreatic edema. Spleen: No splenomegaly. No focal mass lesion. Adrenals/Urinary Tract: No adrenal nodule or mass. Kidneys unremarkable. No evidence for hydroureter. The urinary bladder appears normal for the degree of distention. Stomach/Bowel: Stomach is unremarkable. No gastric wall thickening. No evidence of outlet obstruction.  Duodenum is normally positioned as is the ligament of Treitz. No small bowel wall thickening. No small bowel dilatation. The terminal ileum is normal. The appendix is normal. No gross colonic mass. No colonic wall thickening. Vascular/Lymphatic: No abdominal aortic aneurysm. No abdominal aortic atherosclerotic calcification. There is no gastrohepatic or hepatoduodenal ligament lymphadenopathy. No retroperitoneal or mesenteric lymphadenopathy. No pelvic sidewall lymphadenopathy. Reproductive: The uterus is unremarkable.  There is no adnexal mass. Other: No intraperitoneal free fluid. Subtle edema/inflammation  is identified in the right upper quadrant of the abdomen, along the inferior tip of the right liver extending up towards the gallbladder fossa and involving the omentum of the hepatic flexure. The etiology of this finding is not readily apparent as there is no evidence for gallstones or gallbladder wall thickening. The right colon shows no diverticular disease or wall thickening. The appearance of the edema/inflammation is not entirely typical for epiploic appendagitis. Musculoskeletal: No worrisome lytic or sclerotic osseous abnormality. Review of the MIP images confirms the above findings. IMPRESSION: 1. No CT evidence for acute pulmonary embolus. 2. Subtle edema/inflammation in the right upper quadrant of the abdomen, along the inferior tip of the right liver extending up towards the gallbladder fossa and involving the omentum of the hepatic flexure. Source of this finding is not evident. Features are not entirely characteristic for epiploic appendagitis although this would be a consideration. Gallbladder source for the edema/inflammation is also possible although no gallstones or gallbladder wall thickening evident and there is no frank pericholecystic fluid. No colonic wall thickening to suggest colitis as an etiology. Findings are more subtle and diffuse than typically seen in the setting of omental infarct. Electronically Signed   By: Kennith Center M.D.   On: 08/08/2021 06:41    Procedures Procedures   Medications Ordered in ED Medications  morphine 4 MG/ML injection 4 mg (has no administration in time range)  ondansetron (ZOFRAN) injection 4 mg (has no administration in time range)    ED Course  I have reviewed the triage vital signs and the nursing notes.  Pertinent labs & imaging results that were available during my care of the patient were reviewed by me and considered in my medical decision making (see chart for details).   MDM Rules/Calculators/A&P                         Pleuritic  chest pain in the setting of COVID-19.  Presence of tachycardia is worrisome for pulmonary embolism.  Also consider biliary tract disease, diverticulitis, pancreatitis.  Old records are reviewed confirming diagnosis of COVID-19 yesterday.  Because of increased risk of thromboembolism and COVID-19, will go straight to CT angiogram of chest.  We will also get CT of abdomen and pelvis at the same time.    CT scan shows no evidence of pulmonary embolism.  There is evidence of some inflammation along the liver with exact cause not clear.  Patient states that she is feeling better, and heart rate has improved.  On reexam, she continues to have only mild tenderness.  I have explained to the patient that the cause of her pain is not clear but there is definitely some inflammation seen.  States she is discharged with instructions to return if pain seems to be worsening.  Otherwise, follow-up with PCP.  Final Clinical Impression(s) / ED Diagnoses Final diagnoses:  RUQ pain  Microcytic anemia    Rx / DC Orders ED Discharge Orders     None  Dione Booze, MD 08/08/21 [phone]

## 2021-08-08 NOTE — ED Notes (Signed)
Patient transported to CT 

## 2021-08-08 NOTE — ED Triage Notes (Signed)
Pt states she was diagnosed on Monday with Covid at her dr office  Pt states she developed a pain on her right side under her ribs and has been short of breath  Pt states it is worse when she lays down

## 2021-08-08 NOTE — Discharge Instructions (Addendum)
The cause for your pain was not clear on your CT scan, but there was definitely some evidence of some inflammation.  If your symptoms are getting worse, please return to the emergency department for reevaluation.

## 2023-04-23 ENCOUNTER — Encounter: Payer: Self-pay | Admitting: Emergency Medicine

## 2023-04-23 ENCOUNTER — Other Ambulatory Visit: Payer: Self-pay

## 2023-04-23 ENCOUNTER — Ambulatory Visit
Admission: EM | Admit: 2023-04-23 | Discharge: 2023-04-23 | Disposition: A | Discharge status: Home / Self Care | Payer: BC Managed Care – PPO | Attending: Emergency Medicine | Admitting: Emergency Medicine

## 2023-04-23 DIAGNOSIS — N920 Excessive and frequent menstruation with regular cycle: Secondary | ICD-10-CM | POA: Diagnosis not present

## 2023-04-23 LAB — POCT URINE PREGNANCY: Preg Test, Ur: NEGATIVE

## 2023-04-23 MED ORDER — LEVONORGEST-ETH ESTRAD 91-DAY 0.15-0.03 &0.01 MG PO TABS: 1.0000 | ORAL_TABLET | Freq: Every day | ORAL | 0 refills | Status: AC

## 2023-04-23 NOTE — Discharge Instructions (Addendum)
Work on finding a new primary care provider. To find one at [facility], go to [email or web address], click on "primary care," click on "new patients: schedule online," choose either family medicine or internal medicine (or gynecology for your PCOS care), and choose an appointment place and time that fits your schedule.

## 2023-04-23 NOTE — ED Triage Notes (Signed)
Pt here for heavy vaginal bleeding x 3 days more than with usual period; pt sts has PCOS and sometimes has heavy periods

## 2023-04-23 NOTE — ED Provider Notes (Addendum)
[facility]    CSN: [id number] Arrival date & time: 04/23/23  0907      History   Chief Complaint Chief Complaint  Patient presents with   Vaginal Bleeding    Entered by patient    HPI [name] is a 37 y.o. female. Has hx PCOS that causes heavier periods - is not on OCPs right now to control this. Is in between PCPs. Usually her heavier periods with PCOS just mean a light to medium flow for more days than normal (10-12) but this time she has been bleeding heavily for 3 days - has not had this happen in awhile. Has mild cramping pain in B low abd. Denies dysuria or urinary tract symptoms. Has had a little diarrhea lately.    Vaginal Bleeding   Past Medical History:  Diagnosis Date   Genital herpes    Gestational diabetes    PCOS (polycystic ovarian syndrome)    Pregnancy induced hypertension    with 1st pregnancy   Preterm labor    1st baby delivered at 35 wks    Patient Active Problem List   Diagnosis Date Noted   PCOS (polycystic ovarian syndrome) 12/13/2014   Abnormal uterine bleeding (AUB) 09/24/2014   Normal delivery 08/16/2011   Genital herpes     Past Surgical History:  Procedure Laterality Date   CESAREAN SECTION     TOOTH EXTRACTION     WISDOM TOOTH EXTRACTION  2007    OB History     Gravida  2   Para  2   Term  0   Preterm  2   AB  0   Living  2      SAB  0   IAB  0   Ectopic  0   Multiple  0   Live Births  1            Home Medications    Prior to Admission medications   Medication Sig Start Date End Date Taking? Authorizing Provider  Levonorgestrel-Ethinyl Estradiol (AMETHIA) 0.15-0.03 &0.01 MG tablet Take 1 tablet by mouth daily. 04/23/23  Yes Cathlyn Parsons, NP    Family History Family History  Problem Relation Age of Onset   Diabetes Maternal Grandmother     Social History Social History   Tobacco Use   Smoking status: Never   Smokeless tobacco: Never  Vaping Use   Vaping Use: Never  used  Substance Use Topics   Alcohol use: Yes    Comment: occ   Drug use: No     Allergies   Patient has no known allergies.   Review of Systems Review of Systems  Genitourinary:  Positive for vaginal bleeding.     Physical Exam Triage Vital Signs ED Triage Vitals [04/23/23 0922]  Enc Vitals Group     BP (!) 160/102     Pulse Rate 87     Resp 18     Temp 98.6 F (37 C)     Temp Source Oral     SpO2 97 %     Weight      Height      Head Circumference      Peak Flow      Pain Score 0     Pain Loc      Pain Edu?      Excl. in GC?    No data found.  Updated Vital Signs BP 129/89 (BP Location: Left Arm)   Pulse 87  Temp 98.6 F (37 C) (Oral)   Resp 18   SpO2 97%   Visual Acuity Right Eye Distance:   Left Eye Distance:   Bilateral Distance:    Right Eye Near:   Left Eye Near:    Bilateral Near:     Physical Exam Constitutional:      General: She is not in acute distress.    Appearance: Normal appearance. She is not ill-appearing.  Cardiovascular:     Rate and Rhythm: Normal rate and regular rhythm.  Pulmonary:     Effort: Pulmonary effort is normal.     Breath sounds: Normal breath sounds.  Abdominal:     General: Abdomen is flat. Bowel sounds are normal.     Palpations: Abdomen is soft.     Tenderness: There is no guarding or rebound.  Neurological:     Mental Status: She is alert.      UC Treatments / Results  Labs (all labs ordered are listed, but only abnormal results are displayed) Labs Reviewed  POCT URINE PREGNANCY    EKG   Radiology No results found.  Procedures Procedures (including critical care time)  Medications Ordered in UC Medications - No data to display  Initial Impression / Assessment and Plan / UC Course  I have reviewed the triage vital signs and the nursing notes.  Pertinent labs & imaging results that were available during my care of the patient were reviewed by me and considered in my medical decision  making (see chart for details).    Urine pregnancy negative. Likely just heavy bleeding from PCOS. Discussed need to reengage with a pcp. Discussed [facility] open scheduling to find pcp.   Will restart OCPs for her for 3 months - she will need to find a pcp in that time to get furhter rx.   BP initially high, much improved on recheck.   Final Clinical Impressions(s) / UC Diagnoses   Final diagnoses:  Menorrhagia with regular cycle     Discharge Instructions      Work on finding a new primary care provider. To find one at [facility], go to [email or web address], click on "primary care," click on "new patients: schedule online," choose either family medicine or internal medicine (or gynecology for your PCOS care), and choose an appointment place and time that fits your schedule.     ED Prescriptions     Medication Sig Dispense Auth. Provider   Levonorgestrel-Ethinyl Estradiol (AMETHIA) 0.15-0.03 &0.01 MG tablet Take 1 tablet by mouth daily. 91 tablet Cathlyn Parsons, NP      PDMP not reviewed this encounter.   Cathlyn Parsons, NP 04/23/23 1101    Cathlyn Parsons, NP 04/23/23 1104

## 2023-07-05 IMAGING — CR DG CHEST 2V
2 series · 2 of 2 positions shown · non-contrast
Comparison: 01/01/2019

CLINICAL DATA: Cough, right chest pain

EXAM:
CHEST - 2 VIEW

[w chest pa]
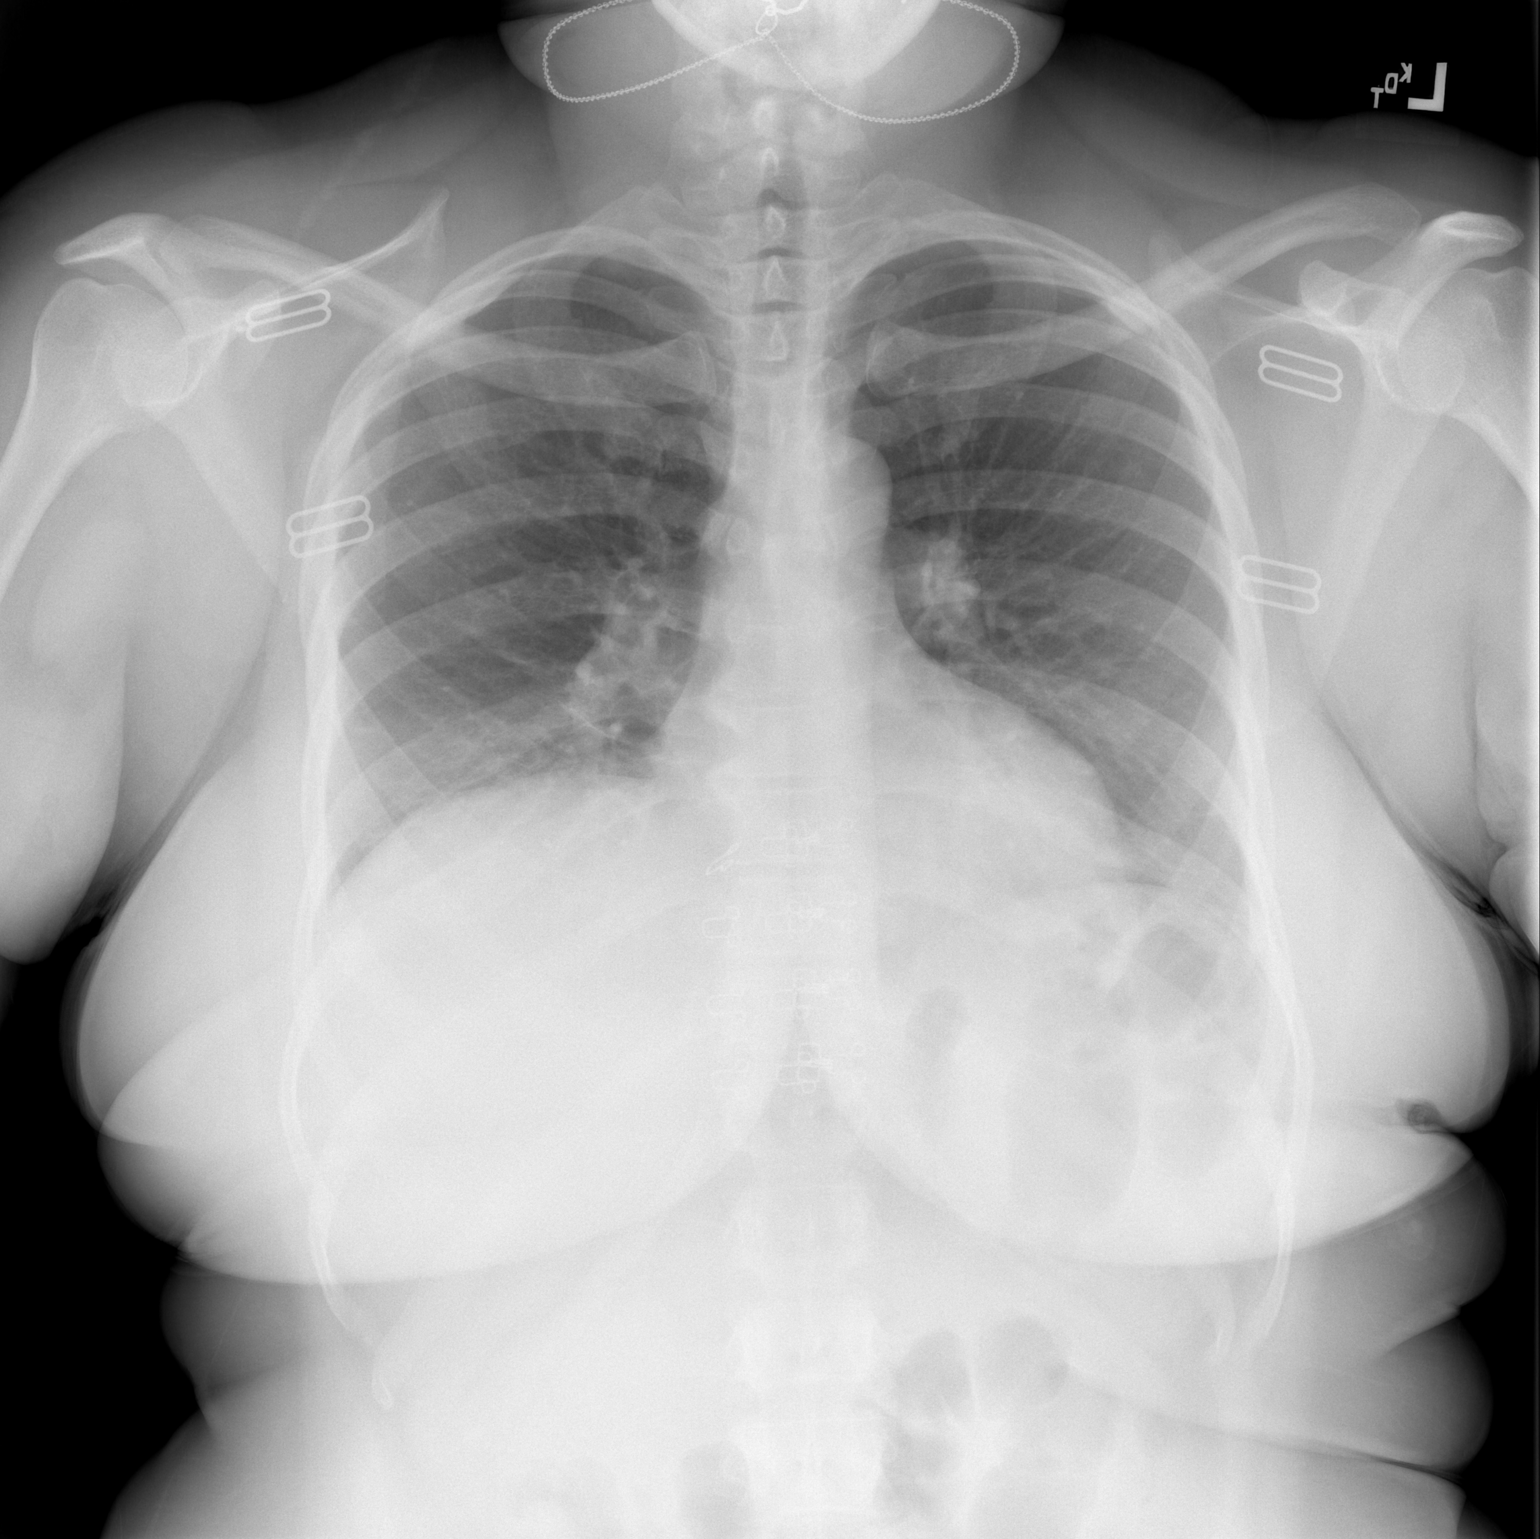

[w chest lat]
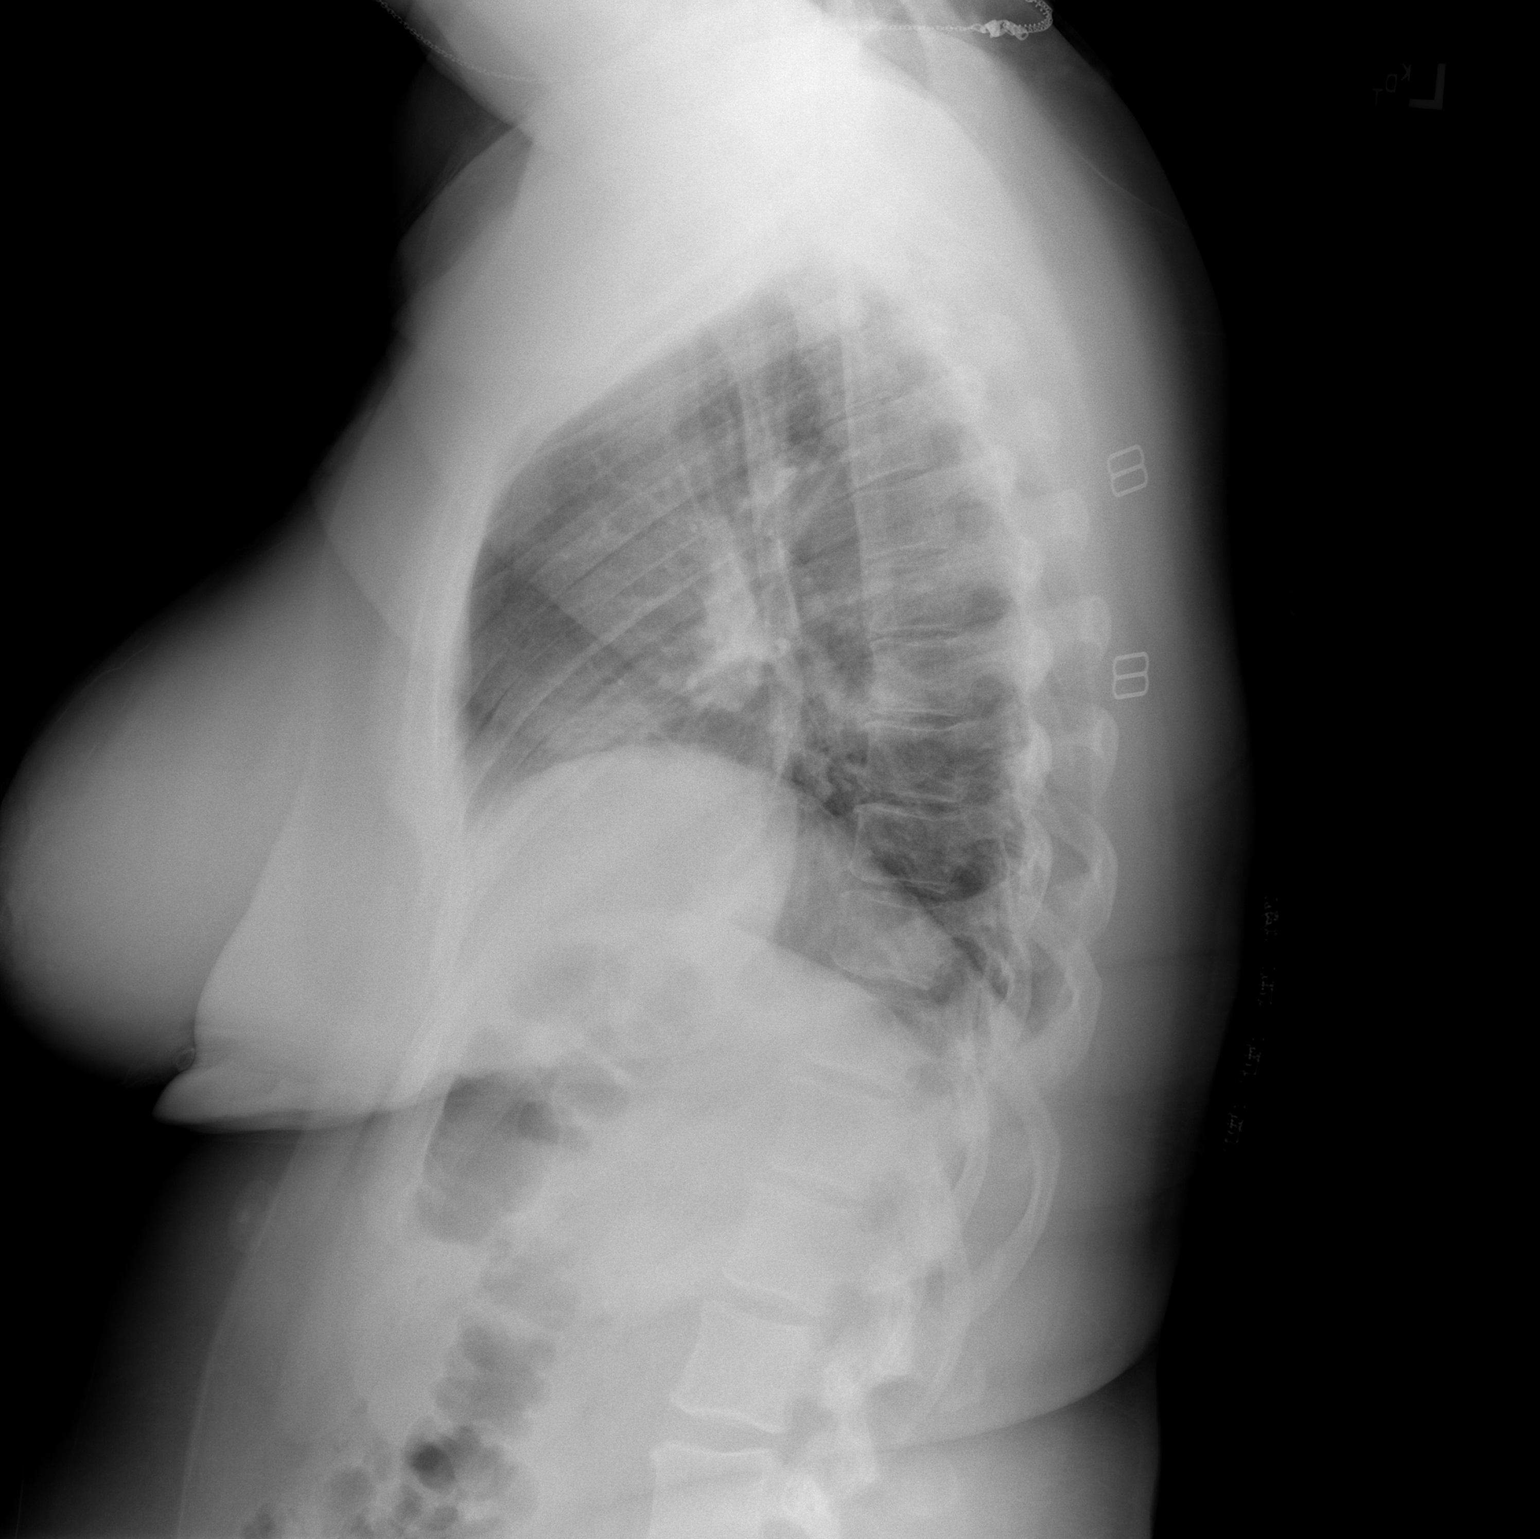

[2 of 2 positions shown; findings below may reference images not displayed]

FINDINGS: Low lung volumes. Lungs are clear. No pleural effusion or
pneumothorax.

The heart is normal in size.

Visualized osseous structures are within normal limits.
IMPRESSION: Normal chest radiographs.
